# Patient Record
Sex: Female | Born: 1970 | Race: Black or African American | Hispanic: No | Smoking: Current some day smoker
Health system: Southern US, Community
[De-identification: ages and names within clinical notes are randomized; demographics above are authoritative.]

## PROBLEM LIST (undated history)

## (undated) DIAGNOSIS — R011 Cardiac murmur, unspecified: Secondary | ICD-10-CM

## (undated) DIAGNOSIS — I10 Essential (primary) hypertension: Secondary | ICD-10-CM

## (undated) DIAGNOSIS — Z789 Other specified health status: Secondary | ICD-10-CM

---

## 1998-12-27 ENCOUNTER — Inpatient Hospital Stay (HOSPITAL_COMMUNITY): Admission: AD | Admit: 1998-12-27 | Discharge: 1998-12-29 | Payer: Self-pay | Admitting: Obstetrics

## 1998-12-28 ENCOUNTER — Encounter: Payer: Self-pay | Admitting: Obstetrics & Gynecology

## 2004-05-03 ENCOUNTER — Emergency Department: Payer: Self-pay | Admitting: Emergency Medicine

## 2004-12-01 ENCOUNTER — Emergency Department: Payer: Self-pay | Admitting: Emergency Medicine

## 2005-05-24 ENCOUNTER — Other Ambulatory Visit: Payer: Self-pay

## 2005-05-24 ENCOUNTER — Emergency Department: Payer: Self-pay | Admitting: Emergency Medicine

## 2006-05-02 ENCOUNTER — Emergency Department: Payer: Self-pay | Admitting: Internal Medicine

## 2008-10-22 ENCOUNTER — Emergency Department: Payer: Self-pay | Admitting: Emergency Medicine

## 2009-01-15 ENCOUNTER — Emergency Department: Payer: Self-pay | Admitting: Emergency Medicine

## 2010-12-01 ENCOUNTER — Emergency Department: Payer: Self-pay | Admitting: Emergency Medicine

## 2011-06-23 ENCOUNTER — Emergency Department: Payer: Self-pay | Admitting: *Deleted

## 2012-05-24 ENCOUNTER — Emergency Department: Payer: Self-pay | Admitting: Emergency Medicine

## 2012-12-17 ENCOUNTER — Ambulatory Visit: Payer: Self-pay

## 2013-09-06 ENCOUNTER — Emergency Department: Payer: Self-pay | Admitting: Emergency Medicine

## 2015-02-04 ENCOUNTER — Encounter: Payer: Self-pay | Admitting: *Deleted

## 2015-02-04 ENCOUNTER — Emergency Department: Payer: Self-pay

## 2015-02-04 ENCOUNTER — Emergency Department
Admission: EM | Admit: 2015-02-04 | Discharge: 2015-02-04 | Disposition: A | Discharge status: Home / Self Care | Payer: Self-pay | Attending: Emergency Medicine | Admitting: Emergency Medicine

## 2015-02-04 DIAGNOSIS — M47816 Spondylosis without myelopathy or radiculopathy, lumbar region: Secondary | ICD-10-CM | POA: Insufficient documentation

## 2015-02-04 DIAGNOSIS — M47896 Other spondylosis, lumbar region: Secondary | ICD-10-CM

## 2015-02-04 DIAGNOSIS — R109 Unspecified abdominal pain: Secondary | ICD-10-CM | POA: Insufficient documentation

## 2015-02-04 LAB — URINALYSIS COMPLETE WITH MICROSCOPIC (ARMC ONLY)
Bilirubin Urine: NEGATIVE
Glucose, UA: NEGATIVE mg/dL
Hgb urine dipstick: NEGATIVE
Ketones, ur: NEGATIVE mg/dL
Leukocytes, UA: NEGATIVE
Nitrite: NEGATIVE
Protein, ur: NEGATIVE mg/dL
Specific Gravity, Urine: 1.026 (ref 1.005–1.030)
pH: 6 (ref 5.0–8.0)

## 2015-02-04 MED ORDER — MELOXICAM 15 MG PO TABS: 15.0000 mg | ORAL_TABLET | Freq: Every day | ORAL | Status: DC

## 2015-02-04 MED ORDER — IBUPROFEN 800 MG PO TABS
800.0000 mg | ORAL_TABLET | Freq: Once | ORAL | Status: AC
  Administered 2015-02-04: 800 mg via ORAL
  Filled 2015-02-04: qty 1

## 2015-02-04 MED ORDER — TRAMADOL HCL 50 MG PO TABS
50.0000 mg | ORAL_TABLET | Freq: Once | ORAL | Status: AC
  Administered 2015-02-04: 50 mg via ORAL
  Filled 2015-02-04: qty 1

## 2015-02-04 NOTE — ED Notes (Signed)
Pt states pain in her lower back for several months, pt states hx of bulging disc, pt in no distress, alert and oriented

## 2015-02-04 NOTE — ED Notes (Signed)
Pt reports lower back pain x several months. States gradually worsening, has a bulging disc. No new injury.

## 2015-02-04 NOTE — ED Provider Notes (Signed)
[facility] Emergency Department Provider Note  ____________________________________________  Time seen: Approximately 11:55 AM  I have reviewed the triage vital signs and the nursing notes.   HISTORY  Chief Complaint Back Pain    HPI Bonnie Mcdaniel is a 44 y.o. female patient complaining of low back pain and right flank pain. Patient said low back pain is gone on for several months. Patient states she believes is secondary to a history of bulging disc. Patient left flank pain has been going on for couple of days. Patient states she didn't have any dysuria was seen to have increased frequency and urgency. Patient denies any bowel dysfunction. He denies any radicular component to her pain.Patient rating the pain as a 10 over 10 describe the sharp. No palliative measures taken for this complaint.   History reviewed. No pertinent past medical history.  There are no active problems to display for this patient.   History reviewed. No pertinent past surgical history.  Current Outpatient Rx  Name  Route  Sig  Dispense  Refill  . meloxicam (MOBIC) 15 MG tablet   Oral   Take 1 tablet (15 mg total) by mouth daily.   30 tablet   2     Allergies Review of patient's allergies indicates no known allergies.  History reviewed. No pertinent family history.  Social History Social History  Substance Use Topics  . Smoking status: Never Smoker   . Smokeless tobacco: None  . Alcohol Use: No    Review of Systems Constitutional: No fever/chills Eyes: No visual changes. ENT: No sore throat. Cardiovascular: Denies chest pain. Respiratory: Denies shortness of breath. Gastrointestinal: No abdominal pain.  No nausea, no vomiting.  No diarrhea.  No constipation. Genitourinary: Negative for dysuria. Positive urinary frequency and urgency. Musculoskeletal: Positive for back pain Skin: Negative for rash. Neurological: Negative for headaches, focal weakness or  numbness. 10-point ROS otherwise negative.  ____________________________________________   PHYSICAL EXAM:  VITAL SIGNS: ED Triage Vitals  Enc Vitals Group     BP 02/04/15 1121 155/99 mmHg     Pulse Rate 02/04/15 1121 60     Resp 02/04/15 1121 16     Temp 02/04/15 1121 98 F (36.7 C)     Temp Source 02/04/15 1121 Oral     SpO2 02/04/15 1121 100 %     Weight 02/04/15 1121 205 lb (92.987 kg)     Height 02/04/15 1121 5\' 8"  (1.727 m)     Head Cir --      Peak Flow --      Pain Score 02/04/15 1120 10     Pain Loc --      Pain Edu? --      Excl. in GC? --     Constitutional: Alert and oriented. Well appearing and in no acute distress. Eyes: Conjunctivae are normal. PERRL. EOMI. Head: Atraumatic. Nose: No congestion/rhinnorhea. Mouth/Throat: Mucous membranes are moist.  Oropharynx non-erythematous. Neck: No stridor. No cervical spine tenderness to palpation. Hematological/Lymphatic/Immunilogical: No cervical lymphadenopathy. Cardiovascular: Normal rate, regular rhythm. Grossly normal heart sounds.  Good peripheral circulation. Respiratory: Normal respiratory effort.  No retractions. Lungs CTAB. Gastrointestinal: Soft and nontender. No distention. No abdominal bruits. No CVA tenderness. Genitourinary: Deferred Musculoskeletal: No spinal deformity patient's right CVA guarding. Patient has some moderate guarding palpation L3-S1. She has decreased range of motion flexion limited by complaining of pain. Lateral movements full and equal. She had negative straight leg test. . Neurologic:  Normal speech and language. No gross  focal neurologic deficits are appreciated. No gait instability. Skin:  Skin is warm, dry and intact. No rash noted. Psychiatric: Mood and affect are normal. Speech and behavior are normal.  ____________________________________________   LABS (all labs ordered are listed, but only abnormal results are displayed)  Labs Reviewed  URINALYSIS COMPLETEWITH  MICROSCOPIC ([facility] ONLY) - Abnormal; Notable for the following:    Color, Urine YELLOW (*)    APPearance HAZY (*)    Bacteria, UA RARE (*)    Squamous Epithelial / LPF 0-5 (*)    All other components within normal limits   ____________________________________________  EKG   ____________________________________________  RADIOLOGY  X-rays showed distended this mild in nature L5-S1. I, Joni Reining, personally viewed and evaluated these images as part of my medical decision making.   ____________________________________________   PROCEDURES  Procedure(s) performed: None  Critical Care performed: No  ____________________________________________   INITIAL IMPRESSION / ASSESSMENT AND PLAN / ED COURSE  Pertinent labs & imaging results that were available during my care of the patient were reviewed by me and considered in my medical decision making (see chart for details).  Degenerative disc disease of the lumbar spine. Discussed x-ray findings with patient. Advised to [facility]. Patient given prescription for meloxicam 15 mg a take 1 by mouth daily. ____________________________________________   FINAL CLINICAL IMPRESSION(S) / ED DIAGNOSES  Final diagnoses:  Other osteoarthritis of spine, lumbar region      Joni Reining, PA-C 02/04/15 1336  Governor Rooks, MD 02/04/15 [phone]

## 2015-04-15 ENCOUNTER — Ambulatory Visit: Payer: Self-pay | Admitting: Family Medicine

## 2015-06-14 ENCOUNTER — Emergency Department: Payer: 59

## 2015-06-14 ENCOUNTER — Encounter: Payer: Self-pay | Admitting: *Deleted

## 2015-06-14 ENCOUNTER — Emergency Department
Admission: EM | Admit: 2015-06-14 | Discharge: 2015-06-14 | Disposition: A | Discharge status: Home / Self Care | Payer: 59 | Attending: Student | Admitting: Student

## 2015-06-14 DIAGNOSIS — Z791 Long term (current) use of non-steroidal anti-inflammatories (NSAID): Secondary | ICD-10-CM | POA: Insufficient documentation

## 2015-06-14 DIAGNOSIS — M549 Dorsalgia, unspecified: Secondary | ICD-10-CM | POA: Diagnosis not present

## 2015-06-14 DIAGNOSIS — J209 Acute bronchitis, unspecified: Secondary | ICD-10-CM | POA: Diagnosis not present

## 2015-06-14 DIAGNOSIS — R05 Cough: Secondary | ICD-10-CM | POA: Diagnosis present

## 2015-06-14 HISTORY — DX: Cardiac murmur, unspecified: R01.1

## 2015-06-14 MED ORDER — GUAIFENESIN-CODEINE 100-10 MG/5ML PO SOLN: 10.0000 mL | Freq: Three times a day (TID) | ORAL | Status: DC | PRN

## 2015-06-14 MED ORDER — AZITHROMYCIN 250 MG PO TABS: ORAL_TABLET | ORAL | Status: DC

## 2015-06-14 MED ORDER — ALBUTEROL SULFATE HFA 108 (90 BASE) MCG/ACT IN AERS: 2.0000 | INHALATION_SPRAY | Freq: Four times a day (QID) | RESPIRATORY_TRACT | Status: DC | PRN

## 2015-06-14 NOTE — ED Notes (Signed)
Patient c/o productive cough with green mucous since last week. Patient c/o chest and back pain with cough.

## 2015-06-14 NOTE — Discharge Instructions (Signed)

## 2015-06-14 NOTE — ED Provider Notes (Signed)
[facility] Emergency Department Provider Note ____________________________________________  Time seen: Approximately 11:57 AM  I have reviewed the triage vital signs and the nursing notes.   HISTORY  Chief Complaint Cough   HPI Bonnie Mcdaniel is a 44 y.o. female who presents to the emergency department for evaluation of cough. Cough has been productive of green mucus. Symptoms have been present for the past 7-10 days. She states that she has pain in her back and chest when she coughs. She has been taking Mucinex and Vicks DayQuil without relief.   Past Medical History  Diagnosis Date  . Murmur     There are no active problems to display for this patient.   History reviewed. No pertinent past surgical history.  Current Outpatient Rx  Name  Route  Sig  Dispense  Refill  . albuterol (PROVENTIL HFA;VENTOLIN HFA) 108 (90 BASE) MCG/ACT inhaler   Inhalation   Inhale 2 puffs into the lungs every 6 (six) hours as needed for wheezing or shortness of breath (cough).   1 Inhaler   2   . azithromycin (ZITHROMAX) 250 MG tablet      2 tablets today, then 1 tablet for the next 4 days.   6 each   0   . guaiFENesin-codeine 100-10 MG/5ML syrup   Oral   Take 10 mLs by mouth 3 (three) times daily as needed.   120 mL   0   . meloxicam (MOBIC) 15 MG tablet   Oral   Take 1 tablet (15 mg total) by mouth daily.   30 tablet   2     Allergies Review of patient's allergies indicates no known allergies.  No family history on file.  Social History Social History  Substance Use Topics  . Smoking status: Never Smoker   . Smokeless tobacco: None  . Alcohol Use: No    Review of Systems Constitutional: No recent illness. Eyes: No visual changes. ENT: No sore throat. Cardiovascular: Denies chest pain or palpitations. Respiratory: Denies shortness of breath. Positive for cough Gastrointestinal: No abdominal pain.  Genitourinary: Negative for  dysuria. Musculoskeletal: Pain in chest and back with cough Skin: Negative for rash. Neurological: Negative for headaches, focal weakness or numbness. 10-point ROS otherwise negative.  ____________________________________________   PHYSICAL EXAM:  VITAL SIGNS: ED Triage Vitals  Enc Vitals Group     BP 06/14/15 1128 155/89 mmHg     Pulse Rate 06/14/15 1128 72     Resp 06/14/15 1128 18     Temp 06/14/15 1128 98.3 F (36.8 C)     Temp Source 06/14/15 1128 Oral     SpO2 06/14/15 1128 98 %     Weight 06/14/15 1128 180 lb (81.647 kg)     Height 06/14/15 1128  (1.727 m)     Head Cir --      Peak Flow --      Pain Score --      Pain Loc --      Pain Edu? --      Excl. in GC? --     Constitutional: Alert and oriented. Well appearing and in no acute distress. Eyes: Conjunctivae are normal. EOMI. Head: Atraumatic. Nose: No congestion/rhinnorhea. Neck: No stridor.  Respiratory: Normal respiratory effort. Lungs clear to auscultation throughout.   Musculoskeletal: Tenderness to chest wall with cough and palpation. Neurologic:  Normal speech and language. No gross focal neurologic deficits are appreciated. Speech is normal. No gait instability. Skin:  Skin is warm,  dry and intact. Atraumatic. Psychiatric: Mood and affect are normal. Speech and behavior are normal.  ____________________________________________   LABS (all labs ordered are listed, but only abnormal results are displayed)  Labs Reviewed - No data to display ____________________________________________  RADIOLOGY  Chest x-ray negative for acute cardiopulmonary abnormality. ____________________________________________   PROCEDURES  Procedure(s) performed: None   ____________________________________________   INITIAL IMPRESSION / ASSESSMENT AND PLAN / ED COURSE  Pertinent labs & imaging results that were available during my care of the patient were reviewed by me and considered in my medical decision  making (see chart for details).  Patient was prescribed azithromycin and Robitussin AC as well as Ventolin inhaler. She is to follow-up with the primary care provider for symptoms that are not improving over the next 2-3 days. She was advised to return to the emergency department for symptoms that change or worsen if she is unable schedule an appointment. ____________________________________________   FINAL CLINICAL IMPRESSION(S) / ED DIAGNOSES  Final diagnoses:  Acute bronchitis, unspecified organism       Chinita PesterCari B Lyrika Souders, FNP 06/14/15 1303  Gayla DossEryka A Gayle, MD 06/14/15 1620

## 2016-03-23 ENCOUNTER — Encounter: Payer: Self-pay | Admitting: Emergency Medicine

## 2016-03-23 ENCOUNTER — Emergency Department: Payer: Self-pay

## 2016-03-23 ENCOUNTER — Inpatient Hospital Stay
Admission: EM | Admit: 2016-03-23 | Discharge: 2016-03-25 | DRG: 871 | Disposition: A | Discharge status: Home / Self Care | Payer: Self-pay | Attending: Internal Medicine | Admitting: Internal Medicine

## 2016-03-23 DIAGNOSIS — A403 Sepsis due to Streptococcus pneumoniae: Principal | ICD-10-CM | POA: Diagnosis present

## 2016-03-23 DIAGNOSIS — J154 Pneumonia due to other streptococci: Secondary | ICD-10-CM | POA: Diagnosis present

## 2016-03-23 DIAGNOSIS — A419 Sepsis, unspecified organism: Secondary | ICD-10-CM

## 2016-03-23 DIAGNOSIS — Z79899 Other long term (current) drug therapy: Secondary | ICD-10-CM

## 2016-03-23 DIAGNOSIS — N390 Urinary tract infection, site not specified: Secondary | ICD-10-CM | POA: Diagnosis present

## 2016-03-23 DIAGNOSIS — Z8249 Family history of ischemic heart disease and other diseases of the circulatory system: Secondary | ICD-10-CM

## 2016-03-23 DIAGNOSIS — J189 Pneumonia, unspecified organism: Secondary | ICD-10-CM

## 2016-03-23 DIAGNOSIS — E876 Hypokalemia: Secondary | ICD-10-CM | POA: Diagnosis present

## 2016-03-23 DIAGNOSIS — J181 Lobar pneumonia, unspecified organism: Secondary | ICD-10-CM

## 2016-03-23 HISTORY — DX: Other specified health status: Z78.9

## 2016-03-23 LAB — URINALYSIS COMPLETE WITH MICROSCOPIC (ARMC ONLY)
BACTERIA UA: NONE SEEN
Bilirubin Urine: NEGATIVE
Glucose, UA: NEGATIVE mg/dL
Nitrite: NEGATIVE
PROTEIN: 30 mg/dL — AB
SPECIFIC GRAVITY, URINE: 1.021 (ref 1.005–1.030)
pH: 6 (ref 5.0–8.0)

## 2016-03-23 LAB — CSF CELL COUNT WITH DIFFERENTIAL
EOS CSF: 0 %
EOS CSF: 0 %
LYMPHS CSF: 65 %
Lymphs, CSF: 66 %
Monocyte-Macrophage-Spinal Fluid: 35 %
Monocyte-Macrophage-Spinal Fluid: 30 %
RBC Count, CSF: 2 /mm3 (ref 0–3)
RBC Count, CSF: 187 /mm3 — ABNORMAL HIGH (ref 0–3)
SEGMENTED NEUTROPHILS-CSF: 4 %
Segmented Neutrophils-CSF: 0 %
TUBE #: 4
TUBE #: 1
WBC, CSF: 4 /mm3 (ref 0–5)
WBC, CSF: 4 /mm3 (ref 0–5)

## 2016-03-23 LAB — CBC WITH DIFFERENTIAL/PLATELET
Basophils Absolute: 0.2 10*3/uL — ABNORMAL HIGH (ref 0–0.1)
Basophils Relative: 1 %
EOS ABS: 0 10*3/uL (ref 0–0.7)
EOS PCT: 0 %
HCT: 38.5 % (ref 35.0–47.0)
Hemoglobin: 13.2 g/dL (ref 12.0–16.0)
LYMPHS ABS: 0.4 10*3/uL — AB (ref 1.0–3.6)
Lymphocytes Relative: 1 %
MCH: 31.5 pg (ref 26.0–34.0)
MCHC: 34.4 g/dL (ref 32.0–36.0)
MCV: 91.7 fL (ref 80.0–100.0)
MONO ABS: 0.5 10*3/uL (ref 0.2–0.9)
Monocytes Relative: 2 %
Neutro Abs: 28.7 10*3/uL — ABNORMAL HIGH (ref 1.4–6.5)
Neutrophils Relative %: 96 %
PLATELETS: 257 10*3/uL (ref 150–440)
RBC: 4.2 MIL/uL (ref 3.80–5.20)
RDW: 14.9 % — AB (ref 11.5–14.5)
WBC: 29.7 10*3/uL — AB (ref 3.6–11.0)

## 2016-03-23 LAB — COMPREHENSIVE METABOLIC PANEL
ALT: 12 U/L — AB (ref 14–54)
ANION GAP: 6 (ref 5–15)
AST: 18 U/L (ref 15–41)
Albumin: 3.6 g/dL (ref 3.5–5.0)
Alkaline Phosphatase: 50 U/L (ref 38–126)
BUN: 11 mg/dL (ref 6–20)
CHLORIDE: 104 mmol/L (ref 101–111)
CO2: 24 mmol/L (ref 22–32)
CREATININE: 0.94 mg/dL (ref 0.44–1.00)
Calcium: 8.6 mg/dL — ABNORMAL LOW (ref 8.9–10.3)
Glucose, Bld: 106 mg/dL — ABNORMAL HIGH (ref 65–99)
POTASSIUM: 2.6 mmol/L — AB (ref 3.5–5.1)
SODIUM: 134 mmol/L — AB (ref 135–145)
Total Bilirubin: 1.2 mg/dL (ref 0.3–1.2)
Total Protein: 7.4 g/dL (ref 6.5–8.1)

## 2016-03-23 LAB — PROTEIN AND GLUCOSE, CSF
GLUCOSE CSF: 62 mg/dL (ref 40–70)
Total  Protein, CSF: 24 mg/dL (ref 15–45)

## 2016-03-23 LAB — PROTIME-INR
INR: 1.03
PROTHROMBIN TIME: 13.5 s (ref 11.4–15.2)

## 2016-03-23 LAB — LACTIC ACID, PLASMA: LACTIC ACID, VENOUS: 1.3 mmol/L (ref 0.5–1.9)

## 2016-03-23 LAB — INFLUENZA PANEL BY PCR (TYPE A & B)
H1N1FLUPCR: NOT DETECTED
Influenza A By PCR: NEGATIVE
Influenza B By PCR: NEGATIVE

## 2016-03-23 LAB — LIPASE, BLOOD: LIPASE: 20 U/L (ref 11–51)

## 2016-03-23 LAB — APTT: APTT: 33 s (ref 24–36)

## 2016-03-23 MED ORDER — DEXTROSE 5 % IV SOLN
500.0000 mg | INTRAVENOUS | Status: DC
  Administered 2016-03-24 (×2): 500 mg via INTRAVENOUS
  Filled 2016-03-23 (×3): qty 500

## 2016-03-23 MED ORDER — PIPERACILLIN-TAZOBACTAM 3.375 G IVPB: 3.3750 g | Freq: Three times a day (TID) | INTRAVENOUS | Status: DC

## 2016-03-23 MED ORDER — SODIUM CHLORIDE 0.9 % IV BOLUS (SEPSIS)
1000.0000 mL | Freq: Once | INTRAVENOUS | Status: AC
  Administered 2016-03-23: 1000 mL via INTRAVENOUS

## 2016-03-23 MED ORDER — LORAZEPAM 2 MG/ML IJ SOLN
1.0000 mg | Freq: Once | INTRAMUSCULAR | Status: AC
  Administered 2016-03-23: 1 mg via INTRAVENOUS
  Filled 2016-03-23: qty 1

## 2016-03-23 MED ORDER — VANCOMYCIN HCL IN DEXTROSE 750-5 MG/150ML-% IV SOLN: 750.0000 mg | Freq: Three times a day (TID) | INTRAVENOUS | Status: DC

## 2016-03-23 MED ORDER — SODIUM CHLORIDE 0.9 % IV BOLUS (SEPSIS)
500.0000 mL | Freq: Once | INTRAVENOUS | Status: AC
  Administered 2016-03-23: 500 mL via INTRAVENOUS

## 2016-03-23 MED ORDER — MORPHINE SULFATE (PF) 4 MG/ML IV SOLN
4.0000 mg | Freq: Once | INTRAVENOUS | Status: AC
  Administered 2016-03-23: 4 mg via INTRAVENOUS
  Filled 2016-03-23: qty 1

## 2016-03-23 MED ORDER — INFLUENZA VAC SPLIT QUAD 0.5 ML IM SUSY
0.5000 mL | PREFILLED_SYRINGE | INTRAMUSCULAR | Status: AC
  Administered 2016-03-24: 10:00:00 0.5 mL via INTRAMUSCULAR
  Filled 2016-03-23: qty 0.5

## 2016-03-23 MED ORDER — GUAIFENESIN-CODEINE 100-10 MG/5ML PO SOLN: 10.0000 mL | ORAL | Status: DC | PRN

## 2016-03-23 MED ORDER — LIDOCAINE HCL (PF) 1 % IJ SOLN
INTRAMUSCULAR | Status: DC
Start: 2016-03-23 — End: 2016-03-23
  Filled 2016-03-23: qty 5

## 2016-03-23 MED ORDER — DEXTROSE 5 % IV SOLN
INTRAVENOUS | Status: AC
  Filled 2016-03-23: qty 10

## 2016-03-23 MED ORDER — ALBUTEROL SULFATE (2.5 MG/3ML) 0.083% IN NEBU: 3.0000 mL | INHALATION_SOLUTION | Freq: Four times a day (QID) | RESPIRATORY_TRACT | Status: DC | PRN

## 2016-03-23 MED ORDER — VANCOMYCIN HCL IN DEXTROSE 750-5 MG/150ML-% IV SOLN
750.0000 mg | Freq: Three times a day (TID) | INTRAVENOUS | Status: DC
  Filled 2016-03-23 (×3): qty 150

## 2016-03-23 MED ORDER — MELOXICAM 7.5 MG PO TABS
15.0000 mg | ORAL_TABLET | Freq: Every day | ORAL | Status: DC
  Administered 2016-03-24 – 2016-03-25 (×2): 15 mg via ORAL
  Filled 2016-03-23 (×2): qty 2

## 2016-03-23 MED ORDER — HEPARIN SODIUM (PORCINE) 5000 UNIT/ML IJ SOLN
5000.0000 [IU] | Freq: Three times a day (TID) | INTRAMUSCULAR | Status: DC
  Administered 2016-03-24: 06:00:00 5000 [IU] via SUBCUTANEOUS
  Filled 2016-03-23: qty 1

## 2016-03-23 MED ORDER — POTASSIUM CHLORIDE CRYS ER 20 MEQ PO TBCR
40.0000 meq | EXTENDED_RELEASE_TABLET | Freq: Two times a day (BID) | ORAL | Status: AC
  Administered 2016-03-23 – 2016-03-24 (×3): 40 meq via ORAL
  Filled 2016-03-23 (×3): qty 2

## 2016-03-23 MED ORDER — PIPERACILLIN-TAZOBACTAM 3.375 G IVPB 30 MIN
3.3750 g | Freq: Once | INTRAVENOUS | Status: AC
  Administered 2016-03-23: 3.375 g via INTRAVENOUS
  Filled 2016-03-23: qty 50

## 2016-03-23 MED ORDER — ACETAMINOPHEN 500 MG PO TABS
1000.0000 mg | ORAL_TABLET | Freq: Once | ORAL | Status: AC
  Administered 2016-03-23: 1000 mg via ORAL
  Filled 2016-03-23: qty 2

## 2016-03-23 MED ORDER — DEXTROSE 5 % IV SOLN
1.0000 g | INTRAVENOUS | Status: DC
  Administered 2016-03-23: 1 g via INTRAVENOUS

## 2016-03-23 MED ORDER — VANCOMYCIN HCL IN DEXTROSE 1-5 GM/200ML-% IV SOLN
1000.0000 mg | Freq: Once | INTRAVENOUS | Status: AC
  Administered 2016-03-23: 1000 mg via INTRAVENOUS
  Filled 2016-03-23: qty 200

## 2016-03-23 MED ORDER — LIDOCAINE HCL (PF) 1 % IJ SOLN
INTRAMUSCULAR | Status: AC
  Filled 2016-03-23: qty 5

## 2016-03-23 MED ORDER — ONDANSETRON HCL 4 MG/2ML IJ SOLN
4.0000 mg | Freq: Once | INTRAMUSCULAR | Status: AC
  Administered 2016-03-23: 4 mg via INTRAVENOUS
  Filled 2016-03-23: qty 2

## 2016-03-23 NOTE — ED Notes (Signed)
Patient transported to CT 

## 2016-03-23 NOTE — ED Provider Notes (Signed)
[facility] Emergency Department Provider Note  Time seen: 4:16 PM  I have reviewed the triage vital signs and the nursing notes.   HISTORY  Chief Complaint Fever and Headache    HPI Bonnie Mcdaniel is a 45 y.o. female with no past medical historywho presents to the emergency department with headache and fever cough and congestion. According to the patient for the past one week she has been coughing. Over the past 24 hours she has developed a fever measured at 103 this morning, as well as a significant headache and is describing neck discomfort as well. States mild lower abdominal pain but denies any dysuria. States nausea, patient vomited in the emergency department. Patient states significant headache, worse with light or loud noises. Denies any significant migraine history in the past. Denies any known sick contacts. Describes the headache as moderate to severe. Neck pain is mild. Abdominal pain is mild. Cough is mild.  Past Medical History:  Diagnosis Date  . Murmur     There are no active problems to display for this patient.   History reviewed. No pertinent surgical history.  Prior to Admission medications   Medication Sig Start Date End Date Taking? Authorizing Provider  albuterol (PROVENTIL HFA;VENTOLIN HFA) 108 (90 BASE) MCG/ACT inhaler Inhale 2 puffs into the lungs every 6 (six) hours as needed for wheezing or shortness of breath (cough). 06/14/15   Chinita Pester, FNP  azithromycin (ZITHROMAX) 250 MG tablet 2 tablets today, then 1 tablet for the next 4 days. 06/14/15   Chinita Pester, FNP  guaiFENesin-codeine 100-10 MG/5ML syrup Take 10 mLs by mouth 3 (three) times daily as needed. 06/14/15   Chinita Pester, FNP  meloxicam (MOBIC) 15 MG tablet Take 1 tablet (15 mg total) by mouth daily. 02/04/15   Joni Reining, PA-C    No Known Allergies  History reviewed. No pertinent family history.  Social History Social History  Substance Use Topics   . Smoking status: Never Smoker  . Smokeless tobacco: Not on file  . Alcohol use No    Review of Systems Constitutional: Positive for fever Eyes: Positive for photophobia ENT: Mild congestion Cardiovascular: Negative for chest pain. Respiratory: Negative for shortness of breath. Positive for cough Gastrointestinal: Mild lower abdominal pain. Positive for nausea and vomiting. Negative for diarrhea. Genitourinary: Negative for dysuria. Musculoskeletal: Mild neck pain Skin: Negative for rash. Neurological: Significant headache. Denies focal weakness or numbness. 10-point ROS otherwise negative.  ____________________________________________   PHYSICAL EXAM:  VITAL SIGNS: ED Triage Vitals [03/23/16 1433]  Enc Vitals Group     BP (!) 142/91     Pulse Rate (!) 110     Resp 20     Temp (!) 103.1 F (39.5 C)     Temp Source Oral     SpO2 99 %     Weight 180 lb (81.6 kg)     Height 5\' 8"  (1.727 m)     Head Circumference      Peak Flow      Pain Score 10     Pain Loc      Pain Edu?      Excl. in GC?     Constitutional: Alert and oriented. Keeps eyes closed most of the exam Eyes: Normal exam, moderate photophobia. ENT   Head: Normocephalic and atraumatic. No nuchal rigidity.   Mouth/Throat: Mucous membranes are moist. Cardiovascular: Regular rhythm, rate around 100 bpm. No murmur. Respiratory: Normal respiratory effort without tachypnea nor  retractions. Breath sounds are clear and equal bilaterally. No wheezes/rales/rhonchi. Gastrointestinal: Soft, minimal suprapubic tenderness palpation. No rebound or guarding. Musculoskeletal: Nontender with normal range of motion in all extremities. No lower extremity tenderness or edema. Neurologic:  Normal speech and language. No gross focal neurologic deficits  Skin:  Skin is warm, dry and intact. No rash. Psychiatric: Mood and affect are normal. Speech and behavior are normal.   ____________________________________________     EKG  EKG reviewed and interpreted by social sinus tachycardia 104 bpm, narrow QRS, normal axis, normal intervals, nonspecific ST changes mild lateral depression.  ____________________________________________    RADIOLOGY  Chest x-ray shows right base opacity  ____________________________________________   INITIAL IMPRESSION / ASSESSMENT AND PLAN / ED COURSE  Pertinent labs & imaging results that were available during my care of the patient were reviewed by me and considered in my medical decision making (see chart for details).  Patient presents with cough, congestion, fever to 103 with significant headache and neck pain. Patient's labs are significant for leukocytosis of 29,000. Patient has no nuchal rigidity on exam. Negative Kernig and Brudzinski testing. Patient states cough, x-rays consistent with pneumonia. Given the patient's significant leukocytosis with fever headache and neck pain even though the chest x-ray shows a pneumonia we'll proceed with a lumbar puncture to rule out meningitis. I have also sent a flu swab.  LUMBAR PUNCTURE  Date/Time: 03/23/2016 at 5:34 PM Performed by: Minna AntisPADUCHOWSKI, Jaleia Hanke  Consent: Verbal consent obtained. Written consent obtained. Risks and benefits: risks, benefits and alternatives were discussed Consent given by: patient Patient understanding: patient states understanding of the procedure being performed  Patient consent: the patient's understanding of the procedure matches consent given  Procedure consent: procedure consent matches procedure scheduled  Relevant documents: relevant documents present and verified  Test results: test results available and properly labeled Site marked: the operative site was marked Imaging studies: imaging studies available  Required items: required blood products, implants, devices, and special equipment available  Patient identity confirmed: verbally with patient and arm band  Time out: Immediately prior  to procedure a "time out" was called to verify the correct patient, procedure, equipment, support staff and site/side marked as required.  Indications: rule out meningitis  Anesthesia: local infiltration Local anesthetic: lidocaine 1% without epinephrine Anesthetic total: 5 ml Patient sedated: morphine for analgesic Analgesia: morphine Preparation: Patient was prepped and draped in the usual sterile fashion. Lumbar space: L3-L4 interspace Patient's position: left lateral decubitus Needle gauge: 22 Needle length: 3.5 in Number of attempts: 2  Fluid appearance: clear Tubes of fluid: 4 Total volume: 8 ml Post-procedure: site cleaned and adhesive bandage applied Patient tolerance: Patient tolerated the procedure well with no immediate complications   ----------------------------------------- 7:24 PM on 03/23/2016 -----------------------------------------  CT head is negative. CSF appears negative. Influenza is negative. Urinalysis consistent with mild urinary tract infection. X-ray consistent with a right lobar pneumonia. Patient will be admitted to the hospital for continued treatment.   CRITICAL CARE Performed by: Minna AntisPADUCHOWSKI, Abrish Erny   Total critical care time: 60 minutes  Critical care time was exclusive of separately billable procedures and treating other patients.  Critical care was necessary to treat or prevent imminent or life-threatening deterioration.  Critical care was time spent personally by me on the following activities: development of treatment plan with patient and/or surrogate as well as nursing, discussions with consultants, evaluation of patient's response to treatment, examination of patient, obtaining history from patient or surrogate, ordering and performing treatments and interventions, ordering and  review of laboratory studies, ordering and review of radiographic studies, pulse oximetry and re-evaluation of patient's  condition.  ____________________________________________   FINAL CLINICAL IMPRESSION(S) / ED DIAGNOSES  Sepsis Urinary tract infection pneumonia    Minna Antis, MD 03/23/16 1925

## 2016-03-23 NOTE — ED Notes (Signed)
Pt c/o headache and fever since yesterday. Pt states she has stiff neck and has been vomiting. Pt A&O

## 2016-03-23 NOTE — ED Notes (Signed)
Meal tray given 

## 2016-03-23 NOTE — Progress Notes (Signed)
Pharmacy Antibiotic Note  Iris Remi HaggardL Beshears is a 45 y.o. female admitted on 03/23/2016 with sepsis.  Pharmacy has been consulted for vancomycin and piperacillin/tazobactam dosing.  Plan: Piperacillin/tazobactam 3.375 g IV q8h EI  Vancomycin 1000 mg dose given in ED. Will order vancomycin 750 mg IV q8h Goal vancomycin trough 15-20 mcg/mL Vancomycin trough ordered for 9/28 @ 2130 which is prior to 5th dose and should represent steady state.  Kinetics: Using adjusted body weight of 71 kg Ke: 0.076 Half-life: 9 hours Vd: 49 L  Cmin (estimated) ~16 mcg/mL  Height: 5\' 8"  (172.7 cm) Weight: 180 lb (81.6 kg) IBW/kg (Calculated) : 63.9  Temp (24hrs), Avg:100.9 F (38.3 C), Min:98.7 F (37.1 C), Max:103.1 F (39.5 C)   Recent Labs Lab 03/23/16 1445  WBC 29.7*  CREATININE 0.94  LATICACIDVEN 1.3    Estimated Creatinine Clearance: 85.6 mL/min (by C-G formula based on SCr of 0.94 mg/dL).    No Known Allergies  Antimicrobials this admission: vancomycin 9/27 >>  Piperacillin/tazobactam 9/27 >>   Dose adjustments this admission:  Microbiology results: 9/27 BCx: Sent 9/27 UCx: Sent  9/27 CSF: Sent  Thank you for allowing pharmacy to be a part of this patient's care.  Cindi CarbonMary M Morrisa Aldaba, PharmD, BCPS Clinical Pharmacist 03/23/2016 7:40 PM

## 2016-03-23 NOTE — ED Notes (Signed)
MD at bedside. 

## 2016-03-23 NOTE — H&P (Signed)
[facility] [place] [facility]   PATIENT NAME: Bonnie Mcdaniel    MR#:  [id number]  DATE OF BIRTH:  06-21-1971  DATE OF ADMISSION:  03/23/2016  PRIMARY CARE PHYSICIAN: Evelene Croon, MD   REQUESTING/REFERRING PHYSICIAN: [email or web address]  CHIEF COMPLAINT:   Chief Complaint  Patient presents with  . Fever  . Headache    HISTORY OF PRESENT ILLNESS: Bonnie Mcdaniel  is a 45 y.o. female with a known history of No known medical issues- feeling weakn and cough for 4-5 days- gradually getting weaker. Have brownish sputum production. Fever and chills. Also have headache. In ER noted to have pneumonia, mild positive UA. Lumber puncture is also done- negative.  PAST MEDICAL HISTORY:   Past Medical History:  Diagnosis Date  . Medical history non-contributory   . Murmur     PAST SURGICAL HISTORY: History reviewed. No pertinent surgical history.  SOCIAL HISTORY:  Social History  Substance Use Topics  . Smoking status: Never Smoker  . Smokeless tobacco: Not on file  . Alcohol use No    FAMILY HISTORY:  Family History  Problem Relation Age of Onset  . Hypertension Mother     DRUG ALLERGIES: No Known Allergies  REVIEW OF SYSTEMS:   CONSTITUTIONAL: positive for fever, fatigue or weakness.  EYES: No blurred or double vision.  EARS, NOSE, AND THROAT: No tinnitus or ear pain.  RESPIRATORY: positive for cough, shortness of breath,no wheezing or hemoptysis.  CARDIOVASCULAR: No chest pain, orthopnea, edema.  GASTROINTESTINAL: No nausea, vomiting, diarrhea or abdominal pain.  GENITOURINARY: No dysuria, hematuria.  ENDOCRINE: No polyuria, nocturia,  HEMATOLOGY: No anemia, easy bruising or bleeding SKIN: No rash or lesion. MUSCULOSKELETAL: No joint pain or arthritis.   NEUROLOGIC: No tingling, numbness, weakness.  PSYCHIATRY: No anxiety or depression.   MEDICATIONS AT HOME:  Prior to Admission medications   Medication Sig Start Date End Date Taking? Authorizing  Provider  albuterol (PROVENTIL HFA;VENTOLIN HFA) 108 (90 BASE) MCG/ACT inhaler Inhale 2 puffs into the lungs every 6 (six) hours as needed for wheezing or shortness of breath (cough). 06/14/15   Chinita Pester, FNP  azithromycin (ZITHROMAX) 250 MG tablet 2 tablets today, then 1 tablet for the next 4 days. 06/14/15   Chinita Pester, FNP  guaiFENesin-codeine 100-10 MG/5ML syrup Take 10 mLs by mouth 3 (three) times daily as needed. 06/14/15   Chinita Pester, FNP  meloxicam (MOBIC) 15 MG tablet Take 1 tablet (15 mg total) by mouth daily. 02/04/15   Joni Reining, PA-C      PHYSICAL EXAMINATION:   VITAL SIGNS: Blood pressure (!) 120/56, pulse 100, temperature 98.7 F (37.1 C), temperature source Oral, resp. rate 20, height 5\' 8"  (1.727 m), weight 81.6 kg (180 lb), last menstrual period 03/01/2016, SpO2 96 %.  GENERAL:  45 y.o.-year-old patient lying in the bed with no acute distress.  EYES: Pupils equal, round, reactive to light and accommodation. No scleral icterus. Extraocular muscles intact.  HEENT: Head atraumatic, normocephalic. Oropharynx and nasopharynx clear.  NECK:  Supple, no jugular venous distention. No thyroid enlargement, no tenderness.  LUNGS: Normal breath sounds bilaterally, no wheezing, bilateral crepitation. No use of accessory muscles of respiration.  CARDIOVASCULAR: S1, S2 normal. No murmurs, rubs, or gallops.  ABDOMEN: Soft, nontender, nondistended. Bowel sounds present. No organomegaly or mass.  EXTREMITIES: No pedal edema, cyanosis, or clubbing.  NEUROLOGIC: Cranial nerves II through XII are intact. Muscle strength 5/5 in all extremities. Sensation intact. Gait not checked.  PSYCHIATRIC: The patient is alert and oriented x 3.  SKIN: No obvious rash, lesion, or ulcer.   LABORATORY PANEL:   CBC  Recent Labs Lab 03/23/16 1445  WBC 29.7*  HGB 13.2  HCT 38.5  PLT 257  MCV 91.7  MCH 31.5  MCHC 34.4  RDW 14.9*  LYMPHSABS 0.4*  MONOABS 0.5  EOSABS 0.0   BASOSABS 0.2*   ------------------------------------------------------------------------------------------------------------------  Chemistries   Recent Labs Lab 03/23/16 1445  NA 134*  K 2.6*  CL 104  CO2 24  GLUCOSE 106*  BUN 11  CREATININE 0.94  CALCIUM 8.6*  AST 18  ALT 12*  ALKPHOS 50  BILITOT 1.2   ------------------------------------------------------------------------------------------------------------------ estimated creatinine clearance is 85.6 mL/min (by C-G formula based on SCr of 0.94 mg/dL). ------------------------------------------------------------------------------------------------------------------ No results for input(s): TSH, T4TOTAL, T3FREE, THYROIDAB in the last 72 hours.  Invalid input(s): FREET3   Coagulation profile  Recent Labs Lab 03/23/16 1445  INR 1.03   ------------------------------------------------------------------------------------------------------------------- No results for input(s): DDIMER in the last 72 hours. -------------------------------------------------------------------------------------------------------------------  Cardiac Enzymes No results for input(s): CKMB, TROPONINI, MYOGLOBIN in the last 168 hours.  Invalid input(s): CK ------------------------------------------------------------------------------------------------------------------ Invalid input(s): POCBNP  ---------------------------------------------------------------------------------------------------------------  Urinalysis    Component Value Date/Time   COLORURINE YELLOW (A) 03/23/2016 1700   APPEARANCEUR CLOUDY (A) 03/23/2016 1700   LABSPEC 1.021 03/23/2016 1700   PHURINE 6.0 03/23/2016 1700   GLUCOSEU NEGATIVE 03/23/2016 1700   HGBUR 1+ (A) 03/23/2016 1700   BILIRUBINUR NEGATIVE 03/23/2016 1700   KETONESUR TRACE (A) 03/23/2016 1700   PROTEINUR 30 (A) 03/23/2016 1700   NITRITE NEGATIVE 03/23/2016 1700   LEUKOCYTESUR 1+ (A) 03/23/2016  1700     RADIOLOGY: Ct Head Wo Contrast  Result Date: 03/23/2016 CLINICAL DATA:  Neck and fever since yesterday. Neck stiffness and vomiting. Initial encounter. EXAM: CT HEAD WITHOUT CONTRAST TECHNIQUE: Contiguous axial images were obtained from the base of the skull through the vertex without intravenous contrast. COMPARISON:  None. FINDINGS: Brain: Appears normal without hemorrhage, infarct, mass lesion, mass effect, midline shift or abnormal extra-axial fluid collection. No hydrocephalus or pneumocephalus. Vascular: Unremarkable. Skull: Appears normal. Sinuses/Orbits: Unremarkable. Other: None. IMPRESSION: Normal head CT. Electronically Signed   By: Drusilla Kannerhomas  Dalessio M.D.   On: 03/23/2016 18:28   Dg Chest Portable 1 View  Result Date: 03/23/2016 CLINICAL DATA:  Headache, vomiting EXAM: PORTABLE CHEST 1 VIEW COMPARISON:  06/14/2015 FINDINGS: There is hazy infiltrate/pneumonia right base medially. No pulmonary edema. Bony thorax is unremarkable. IMPRESSION: Hazy infiltrate/ pneumonia right base medially. Follow-up to resolution is recommended. Electronically Signed   By: Natasha MeadLiviu  Pop M.D.   On: 03/23/2016 15:46    EKG: Orders placed or performed during the hospital encounter of 03/23/16  . ED EKG 12-Lead  . ED EKG 12-Lead    IMPRESSION AND PLAN:  * Sepsis   elevated WBCs, tachycardia, tachypnea, Fever.   Pneumonia   IV rocephin+ Azithromycin.   Sputum cx.   Bl cx sent.  * Hypokalemia   Replace oral, recheck.  All the records are reviewed and case discussed with ED provider. Management plans discussed with the patient, family and they are in agreement.  CODE STATUS: Full code. Code Status History    This patient does not have a recorded code status. Please follow your organizational policy for patients in this situation.       TOTAL TIME TAKING CARE OF THIS PATIENT: 50 minutes.    Altamese DillingVACHHANI, Jissel Slavens M.D on 03/23/2016   Between 7am to 6pm -  Pager - [phone]  After  6pm go to [email or web address] - password [email or web address]  [facility] [place] [facility]  Office  [phone]  CC: Primary care physician; Evelene Croon, MD   Note: This dictation was prepared with Dragon dictation along with smaller phrase technology. Any transcriptional errors that result from this process are unintentional.

## 2016-03-23 NOTE — ED Triage Notes (Signed)
Pt c/o headache, neck pain, and fever for 2 days. Has also been vomiting. Pt reports this is worst headache she has had. Hurts to move neck.

## 2016-03-24 LAB — BLOOD CULTURE ID PANEL (REFLEXED)
Acinetobacter baumannii: NOT DETECTED
CANDIDA ALBICANS: NOT DETECTED
CANDIDA GLABRATA: NOT DETECTED
CANDIDA KRUSEI: NOT DETECTED
CANDIDA PARAPSILOSIS: NOT DETECTED
CANDIDA TROPICALIS: NOT DETECTED
ENTEROBACTERIACEAE SPECIES: NOT DETECTED
ESCHERICHIA COLI: NOT DETECTED
Enterobacter cloacae complex: NOT DETECTED
Enterococcus species: NOT DETECTED
HAEMOPHILUS INFLUENZAE: NOT DETECTED
KLEBSIELLA OXYTOCA: NOT DETECTED
KLEBSIELLA PNEUMONIAE: NOT DETECTED
LISTERIA MONOCYTOGENES: NOT DETECTED
Neisseria meningitidis: NOT DETECTED
Proteus species: NOT DETECTED
Pseudomonas aeruginosa: NOT DETECTED
STREPTOCOCCUS AGALACTIAE: NOT DETECTED
STREPTOCOCCUS PNEUMONIAE: DETECTED — AB
Serratia marcescens: NOT DETECTED
Staphylococcus aureus (BCID): NOT DETECTED
Staphylococcus species: NOT DETECTED
Streptococcus pyogenes: NOT DETECTED
Streptococcus species: DETECTED — AB

## 2016-03-24 LAB — URINE CULTURE

## 2016-03-24 LAB — BASIC METABOLIC PANEL
ANION GAP: 4 — AB (ref 5–15)
BUN: 15 mg/dL (ref 6–20)
CO2: 25 mmol/L (ref 22–32)
Calcium: 7.9 mg/dL — ABNORMAL LOW (ref 8.9–10.3)
Chloride: 109 mmol/L (ref 101–111)
Creatinine, Ser: 1.03 mg/dL — ABNORMAL HIGH (ref 0.44–1.00)
GLUCOSE: 94 mg/dL (ref 65–99)
POTASSIUM: 2.8 mmol/L — AB (ref 3.5–5.1)
Sodium: 138 mmol/L (ref 135–145)

## 2016-03-24 LAB — CBC
HEMATOCRIT: 33.9 % — AB (ref 35.0–47.0)
HEMOGLOBIN: 11.5 g/dL — AB (ref 12.0–16.0)
MCH: 31.4 pg (ref 26.0–34.0)
MCHC: 33.9 g/dL (ref 32.0–36.0)
MCV: 92.6 fL (ref 80.0–100.0)
Platelets: 210 10*3/uL (ref 150–440)
RBC: 3.66 MIL/uL — AB (ref 3.80–5.20)
RDW: 15.1 % — ABNORMAL HIGH (ref 11.5–14.5)
WBC: 27.6 10*3/uL — ABNORMAL HIGH (ref 3.6–11.0)

## 2016-03-24 LAB — MAGNESIUM: Magnesium: 1.6 mg/dL — ABNORMAL LOW (ref 1.7–2.4)

## 2016-03-24 MED ORDER — ENOXAPARIN SODIUM 40 MG/0.4ML ~~LOC~~ SOLN: 40.0000 mg | SUBCUTANEOUS | Status: DC

## 2016-03-24 MED ORDER — DEXTROSE 5 % IV SOLN
2.0000 g | INTRAVENOUS | Status: DC
  Administered 2016-03-24 – 2016-03-25 (×2): 2 g via INTRAVENOUS
  Filled 2016-03-24 (×3): qty 2

## 2016-03-24 NOTE — Progress Notes (Signed)
BCID report called from lab 03/24/16 at 1023.  Information called to Clovia CuffLisa Kluttz, Clinical Pharmacist 03/24/16 at 1026  1 set of Blood cultures: both bottles + for GPC= + Streptococcus Pneumoniae.   Bari MantisKristin Liyla Radliff PharmD Clinical Pharmacist 03/24/2016

## 2016-03-24 NOTE — Progress Notes (Signed)
[facility] - [place] [facility]   PATIENT NAME: Bonnie Mcdaniel    MR#:  [id number]  DATE OF BIRTH:  03-09-1971  SUBJECTIVE:  CHIEF COMPLAINT:   Chief Complaint  Patient presents with  . Fever  . Headache     Came with severe weakness and cough fro 4-5 days. Found to have sepsis and pneumonia.   Feels better today.  REVIEW OF SYSTEMS:  CONSTITUTIONAL: No fever, fatigue or weakness.  EYES: No blurred or double vision.  EARS, NOSE, AND THROAT: No tinnitus or ear pain.  RESPIRATORY: positive for cough, shortness of breath, no wheezing or hemoptysis.  CARDIOVASCULAR: No chest pain, orthopnea, edema.  GASTROINTESTINAL: No nausea, vomiting, diarrhea or abdominal pain.  GENITOURINARY: No dysuria, hematuria.  ENDOCRINE: No polyuria, nocturia,  HEMATOLOGY: No anemia, easy bruising or bleeding SKIN: No rash or lesion. MUSCULOSKELETAL: No joint pain or arthritis.   NEUROLOGIC: No tingling, numbness, weakness.  PSYCHIATRY: No anxiety or depression.   ROS  DRUG ALLERGIES:  No Known Allergies  VITALS:  Blood pressure 129/84, pulse 88, temperature 98.5 F (36.9 C), temperature source Oral, resp. rate 18, height 5\' 8"  (1.727 m), weight 81.6 kg (180 lb), last menstrual period 03/01/2016, SpO2 100 %.  PHYSICAL EXAMINATION:  GENERAL:  45 y.o.-year-old patient lying in the bed with no acute distress.  EYES: Pupils equal, round, reactive to light and accommodation. No scleral icterus. Extraocular muscles intact.  HEENT: Head atraumatic, normocephalic. Oropharynx and nasopharynx clear.  NECK:  Supple, no jugular venous distention. No thyroid enlargement, no tenderness.  LUNGS: Normal breath sounds bilaterally, no wheezing, some crepitation. No use of accessory muscles of respiration.  CARDIOVASCULAR: S1, S2 normal. No murmurs, rubs, or gallops.  ABDOMEN: Soft, nontender, nondistended. Bowel sounds present. No organomegaly or mass.  EXTREMITIES: No pedal edema, cyanosis, or  clubbing.  NEUROLOGIC: Cranial nerves II through XII are intact. Muscle strength 5/5 in all extremities. Sensation intact. Gait not checked.  PSYCHIATRIC: The patient is alert and oriented x 3.  SKIN: No obvious rash, lesion, or ulcer.   Physical Exam LABORATORY PANEL:   CBC  Recent Labs Lab 03/24/16 0530  WBC 27.6*  HGB 11.5*  HCT 33.9*  PLT 210   ------------------------------------------------------------------------------------------------------------------  Chemistries   Recent Labs Lab 03/23/16 1445 03/24/16 0530  NA 134* 138  K 2.6* 2.8*  CL 104 109  CO2 24 25  GLUCOSE 106* 94  BUN 11 15  CREATININE 0.94 1.03*  CALCIUM 8.6* 7.9*  AST 18  --   ALT 12*  --   ALKPHOS 50  --   BILITOT 1.2  --    ------------------------------------------------------------------------------------------------------------------  Cardiac Enzymes No results for input(s): TROPONINI in the last 168 hours. ------------------------------------------------------------------------------------------------------------------  RADIOLOGY:  Ct Head Wo Contrast  Result Date: 03/23/2016 CLINICAL DATA:  Neck and fever since yesterday. Neck stiffness and vomiting. Initial encounter. EXAM: CT HEAD WITHOUT CONTRAST TECHNIQUE: Contiguous axial images were obtained from the base of the skull through the vertex without intravenous contrast. COMPARISON:  None. FINDINGS: Brain: Appears normal without hemorrhage, infarct, mass lesion, mass effect, midline shift or abnormal extra-axial fluid collection. No hydrocephalus or pneumocephalus. Vascular: Unremarkable. Skull: Appears normal. Sinuses/Orbits: Unremarkable. Other: None. IMPRESSION: Normal head CT. Electronically Signed   By: Drusilla Kannerhomas  Dalessio M.D.   On: 03/23/2016 18:28   Dg Chest Portable 1 View  Result Date: 03/23/2016 CLINICAL DATA:  Headache, vomiting EXAM: PORTABLE CHEST 1 VIEW COMPARISON:  06/14/2015 FINDINGS: There is hazy infiltrate/pneumonia  right base  medially. No pulmonary edema. Bony thorax is unremarkable. IMPRESSION: Hazy infiltrate/ pneumonia right base medially. Follow-up to resolution is recommended. Electronically Signed   By: Natasha Mead M.D.   On: 03/23/2016 15:46    ASSESSMENT AND PLAN:   Principal Problem:   Sepsis (HCC) Active Problems:   Pneumonia  * Sepsis with strep pneumoia bacteremia   elevated WBCs, tachycardia, tachypnea, Fever.   Pneumonia   IV rocephin+ Azithromycin.   Sputum cx.   Bl cx - strep pneumoia.   Repeat bl cx, ID consult.  * Hypokalemia   Replace oral, recheck.   All the records are reviewed and case discussed with Care Management/Social Workerr. Management plans discussed with the patient, family and they are in agreement.  CODE STATUS: full.  TOTAL TIME TAKING CARE OF THIS PATIENT: 35 minutes.     POSSIBLE D/C IN 1-2 DAYS, DEPENDING ON CLINICAL CONDITION.   Altamese Dilling M.D on 03/24/2016   Between 7am to 6pm - Pager - [phone]  After 6pm go to [email or web address] - password [email or web address]  [facility] [place] [facility]  Office  [phone]  CC: Primary care physician; Evelene Croon, MD  Note: This dictation was prepared with Dragon dictation along with smaller phrase technology. Any transcriptional errors that result from this process are unintentional.

## 2016-03-24 NOTE — Progress Notes (Signed)
PHARMACY - PHYSICIAN COMMUNICATION CRITICAL VALUE ALERT - BLOOD CULTURE IDENTIFICATION (BCID)  Results for orders placed or performed during the hospital encounter of 03/23/16  Blood Culture ID Panel (Reflexed) (Collected: 03/23/2016  3:22 PM)  Result Value Ref Range   Enterococcus species NOT DETECTED NOT DETECTED   Listeria monocytogenes NOT DETECTED NOT DETECTED   Staphylococcus species NOT DETECTED NOT DETECTED   Staphylococcus aureus NOT DETECTED NOT DETECTED   Streptococcus species DETECTED (A) NOT DETECTED   Streptococcus agalactiae NOT DETECTED NOT DETECTED   Streptococcus pneumoniae DETECTED (A) NOT DETECTED   Streptococcus pyogenes NOT DETECTED NOT DETECTED   Acinetobacter baumannii NOT DETECTED NOT DETECTED   Enterobacteriaceae species NOT DETECTED NOT DETECTED   Enterobacter cloacae complex NOT DETECTED NOT DETECTED   Escherichia coli NOT DETECTED NOT DETECTED   Klebsiella oxytoca NOT DETECTED NOT DETECTED   Klebsiella pneumoniae NOT DETECTED NOT DETECTED   Proteus species NOT DETECTED NOT DETECTED   Serratia marcescens NOT DETECTED NOT DETECTED   Haemophilus influenzae NOT DETECTED NOT DETECTED   Neisseria meningitidis NOT DETECTED NOT DETECTED   Pseudomonas aeruginosa NOT DETECTED NOT DETECTED   Candida albicans NOT DETECTED NOT DETECTED   Candida glabrata NOT DETECTED NOT DETECTED   Candida krusei NOT DETECTED NOT DETECTED   Candida parapsilosis NOT DETECTED NOT DETECTED   Candida tropicalis NOT DETECTED NOT DETECTED    Name of physician (or Provider) Contacted: Vachhani  Changes to prescribed antibiotics required: Increased Ceftriaxone dose to 2g. Ordered repeat blood cultures  Clovia CuffLisa Svara Twyman, PharmD, BCPS 03/24/2016 11:23 AM

## 2016-03-24 NOTE — Plan of Care (Signed)
Problem: Respiratory: Goal: Respiratory status will improve Outcome: Progressing Pt verbalized feeling much better, ready to go home; IV antibiotic dose changed per MD order

## 2016-03-25 ENCOUNTER — Other Ambulatory Visit: Payer: Self-pay | Admitting: Infectious Diseases

## 2016-03-25 LAB — CBC
HEMATOCRIT: 33.9 % — AB (ref 35.0–47.0)
Hemoglobin: 11.3 g/dL — ABNORMAL LOW (ref 12.0–16.0)
MCH: 31.2 pg (ref 26.0–34.0)
MCHC: 33.3 g/dL (ref 32.0–36.0)
MCV: 93.4 fL (ref 80.0–100.0)
PLATELETS: 190 10*3/uL (ref 150–440)
RBC: 3.63 MIL/uL — ABNORMAL LOW (ref 3.80–5.20)
RDW: 15.4 % — AB (ref 11.5–14.5)
WBC: 14.3 10*3/uL — AB (ref 3.6–11.0)

## 2016-03-25 LAB — BASIC METABOLIC PANEL
ANION GAP: 3 — AB (ref 5–15)
BUN: 13 mg/dL (ref 6–20)
CALCIUM: 8.3 mg/dL — AB (ref 8.9–10.3)
CO2: 24 mmol/L (ref 22–32)
CREATININE: 0.89 mg/dL (ref 0.44–1.00)
Chloride: 112 mmol/L — ABNORMAL HIGH (ref 101–111)
Glucose, Bld: 80 mg/dL (ref 65–99)
Potassium: 3.5 mmol/L (ref 3.5–5.1)
SODIUM: 139 mmol/L (ref 135–145)

## 2016-03-25 MED ORDER — ALBUTEROL SULFATE HFA 108 (90 BASE) MCG/ACT IN AERS: 2.0000 | INHALATION_SPRAY | Freq: Four times a day (QID) | RESPIRATORY_TRACT | 2 refills | Status: DC | PRN

## 2016-03-25 MED ORDER — GUAIFENESIN-CODEINE 100-10 MG/5ML PO SOLN: 10.0000 mL | Freq: Three times a day (TID) | ORAL | 0 refills | Status: DC | PRN

## 2016-03-25 MED ORDER — AZITHROMYCIN 250 MG PO TABS: 250.0000 mg | ORAL_TABLET | Freq: Every day | ORAL | 0 refills | Status: AC

## 2016-03-25 MED ORDER — DIPHENHYDRAMINE HCL 25 MG PO CAPS: 25.0000 mg | ORAL_CAPSULE | Freq: Every evening | ORAL | Status: DC | PRN

## 2016-03-25 MED ORDER — DIPHENHYDRAMINE HCL 25 MG PO CAPS
25.0000 mg | ORAL_CAPSULE | Freq: Once | ORAL | Status: AC
Start: 2016-03-25 — End: 2016-03-25
  Administered 2016-03-25: 25 mg via ORAL
  Filled 2016-03-25: qty 1

## 2016-03-25 MED ORDER — CEFUROXIME AXETIL 250 MG PO TABS: 250.0000 mg | ORAL_TABLET | Freq: Two times a day (BID) | ORAL | 0 refills | Status: DC

## 2016-03-25 NOTE — Progress Notes (Signed)
[facility]         [place]   03/25/2016  Patient: Bonnie Mcdaniel   Date of Birth:  Jun 30, 1970  Date of admission:  03/23/2016  Date of Discharge  03/25/2016    To Whom it May Concern:   [name]  may return to work on 03/28/16.  PHYSICAL ACTIVITY:  Full, no restrictions.  If you have any questions or concerns, please don't hesitate to call.  Sincerely,   Altamese DillingVACHHANI, Jamarcus Laduke M.D Pager [phone]- [phone] Office : [phone]   .

## 2016-03-25 NOTE — Discharge Summary (Signed)
[facility] - [place] at [facility]   PATIENT NAME: Bonnie Mcdaniel    MR#:  [id number]  DATE OF BIRTH:  1971-06-11  DATE OF ADMISSION:  03/23/2016 ADMITTING PHYSICIAN: Altamese Dilling, MD  DATE OF DISCHARGE: 03/25/2016  2:46 PM  PRIMARY CARE PHYSICIAN: Evelene Croon, MD    ADMISSION DIAGNOSIS:  UTI (lower urinary tract infection) [N39.0] Right lower lobe pneumonia [J18.9] Sepsis, due to unspecified organism (HCC) [A41.9]  DISCHARGE DIAGNOSIS:  Principal Problem:   Sepsis (HCC) Active Problems:   Pneumonia    Bacteremia- strep pneumonia  SECONDARY DIAGNOSIS:   Past Medical History:  Diagnosis Date  . Medical history non-contributory   . Murmur     HOSPITAL COURSE:   * Sepsis with strep pneumoia bacteremia elevated WBCs, tachycardia, tachypnea, Fever. Pneumonia IV rocephin+ Azithromycin. Sputum cx. Bl cx - strep pneumoia. Sensitivity pending, but pt feeling very much at baseline and eager to go home, so advised to follow with PMD and let him confirm sensitivity in 3-4 days.   If she start having fever- advised to come back.   Repeat bl cx, are negative.  * Hypokalemia Replace oral, recheck.  DISCHARGE CONDITIONS:   Stable.  CONSULTS OBTAINED:  Treatment Team:  Mick Sell, MD  DRUG ALLERGIES:  No Known Allergies  DISCHARGE MEDICATIONS:   Discharge Medication List as of 03/25/2016  2:21 PM    START taking these medications   Details  azithromycin (ZITHROMAX) 250 MG tablet Take 1 tablet (250 mg total) by mouth daily., Starting Fri 03/25/2016, Until Mon 03/28/2016, Print    cefUROXime (CEFTIN) 250 MG tablet Take 1 tablet (250 mg total) by mouth 2 (two) times daily with a meal., Starting Fri 03/25/2016, Print      CONTINUE these medications which have CHANGED   Details  albuterol (PROVENTIL HFA;VENTOLIN HFA) 108 (90 Base) MCG/ACT inhaler Inhale 2 puffs into the lungs every 6 (six) hours as needed for  wheezing or shortness of breath (cough)., Starting Fri 03/25/2016, Print    guaiFENesin-codeine 100-10 MG/5ML syrup Take 10 mLs by mouth 3 (three) times daily as needed., Starting Fri 03/25/2016, Print      STOP taking these medications     meloxicam (MOBIC) 15 MG tablet          DISCHARGE INSTRUCTIONS:    Follow with PMD in 3-4 days to follow sensitivity on blood cx.  If you experience worsening of your admission symptoms, develop shortness of breath, life threatening emergency, suicidal or homicidal thoughts you must seek medical attention immediately by calling 911 or calling your MD immediately  if symptoms less severe.  You Must read complete instructions/literature along with all the possible adverse reactions/side effects for all the Medicines you take and that have been prescribed to you. Take any new Medicines after you have completely understood and accept all the possible adverse reactions/side effects.   Please note  You were cared for by a hospitalist during your hospital stay. If you have any questions about your discharge medications or the care you received while you were in the hospital after you are discharged, you can call the unit and asked to speak with the hospitalist on call if the hospitalist that took care of you is not available. Once you are discharged, your primary care physician will handle any further medical issues. Please note that NO REFILLS for any discharge medications will be authorized once you are discharged, as it is imperative that you return to your primary care  physician (or establish a relationship with a primary care physician if you do not have one) for your aftercare needs so that they can reassess your need for medications and monitor your lab values.    Today   CHIEF COMPLAINT:   Chief Complaint  Patient presents with  . Fever  . Headache    HISTORY OF PRESENT ILLNESS:  Bonnie Mcdaniel  is a 45 y.o. female with a known history of No  known medical issues- feeling weakn and cough for 4-5 days- gradually getting weaker. Have brownish sputum production. Fever and chills. Also have headache. In ER noted to have pneumonia, mild positive UA. Lumber puncture is also done- negative.   VITAL SIGNS:  Blood pressure (!) 146/96, pulse 74, temperature 98.5 F (36.9 C), temperature source Oral, resp. rate 19, height 5\' 8"  (1.727 m), weight 81.6 kg (180 lb), last menstrual period 03/01/2016, SpO2 100 %.  I/O:   Intake/Output Summary (Last 24 hours) at 03/25/16 1446 Last data filed at 03/25/16 1300  Gross per 24 hour  Intake             1002 ml  Output                0 ml  Net             1002 ml    PHYSICAL EXAMINATION:   GENERAL:  45 y.o.-year-old patient lying in the bed with no acute distress.  EYES: Pupils equal, round, reactive to light and accommodation. No scleral icterus. Extraocular muscles intact.  HEENT: Head atraumatic, normocephalic. Oropharynx and nasopharynx clear.  NECK:  Supple, no jugular venous distention. No thyroid enlargement, no tenderness.  LUNGS: Normal breath sounds bilaterally, no wheezing, some crepitation. No use of accessory muscles of respiration.  CARDIOVASCULAR: S1, S2 normal. No murmurs, rubs, or gallops.  ABDOMEN: Soft, nontender, nondistended. Bowel sounds present. No organomegaly or mass.  EXTREMITIES: No pedal edema, cyanosis, or clubbing.  NEUROLOGIC: Cranial nerves II through XII are intact. Muscle strength 5/5 in all extremities. Sensation intact. Gait not checked.  PSYCHIATRIC: The patient is alert and oriented x 3.  SKIN: No obvious rash, lesion, or ulcer.   DATA REVIEW:   CBC  Recent Labs Lab 03/25/16 0332  WBC 14.3*  HGB 11.3*  HCT 33.9*  PLT 190    Chemistries   Recent Labs Lab 03/23/16 1445 03/24/16 0530 03/25/16 0332  NA 134* 138 139  K 2.6* 2.8* 3.5  CL 104 109 112*  CO2 24 25 24   GLUCOSE 106* 94 80  BUN 11 15 13   CREATININE 0.94 1.03* 0.89  CALCIUM 8.6*  7.9* 8.3*  MG  --  1.6*  --   AST 18  --   --   ALT 12*  --   --   ALKPHOS 50  --   --   BILITOT 1.2  --   --     Cardiac Enzymes No results for input(s): TROPONINI in the last 168 hours.  Microbiology Results  Results for orders placed or performed during the hospital encounter of 03/23/16  Blood culture (single)     Status: Abnormal (Preliminary result)   Collection Time: 03/23/16  3:22 PM  Result Value Ref Range Status   Specimen Description BLOOD RIGHT ASSIST CONTROL  Final   Special Requests BOTTLES DRAWN AEROBIC AND ANAEROBIC 4CC  Final   Culture  Setup Time   Final    Organism ID to follow GRAM POSITIVE COCCI IN BOTH AEROBIC  AND ANAEROBIC BOTTLES CRITICAL RESULT CALLED TO, READ BACK BY AND VERIFIED WITH: KRISTIN MERRILL 03/24/16 1015 SGD    Culture (A)  Final    STREPTOCOCCUS PNEUMONIAE SUSCEPTIBILITIES TO FOLLOW [facility]formed [facility]    Report Status PENDING  Incomplete  Blood Culture ID Panel (Reflexed)     Status: Abnormal   Collection Time: 03/23/16  3:22 PM  Result Value Ref Range Status   Enterococcus species NOT DETECTED NOT DETECTED Final   Listeria monocytogenes NOT DETECTED NOT DETECTED Final   Staphylococcus species NOT DETECTED NOT DETECTED Final   Staphylococcus aureus NOT DETECTED NOT DETECTED Final   Streptococcus species DETECTED (A) NOT DETECTED Final    Comment: CRITICAL RESULT CALLED TO, READ BACK BY AND VERIFIED WITH: KRISTIN MERRILL 03/24/16 1015 SGD    Streptococcus agalactiae NOT DETECTED NOT DETECTED Final   Streptococcus pneumoniae DETECTED (A) NOT DETECTED Final    Comment: CRITICAL RESULT CALLED TO, READ BACK BY AND VERIFIED WITH: KRISTIN MERRILL 03/24/16 1015 SGD    Streptococcus pyogenes NOT DETECTED NOT DETECTED Final   Acinetobacter baumannii NOT DETECTED NOT DETECTED Final   Enterobacteriaceae species NOT DETECTED NOT DETECTED Final   Enterobacter cloacae complex NOT DETECTED NOT DETECTED Final   Escherichia coli NOT  DETECTED NOT DETECTED Final   Klebsiella oxytoca NOT DETECTED NOT DETECTED Final   Klebsiella pneumoniae NOT DETECTED NOT DETECTED Final   Proteus species NOT DETECTED NOT DETECTED Final   Serratia marcescens NOT DETECTED NOT DETECTED Final   Haemophilus influenzae NOT DETECTED NOT DETECTED Final   Neisseria meningitidis NOT DETECTED NOT DETECTED Final   Pseudomonas aeruginosa NOT DETECTED NOT DETECTED Final   Candida albicans NOT DETECTED NOT DETECTED Final   Candida glabrata NOT DETECTED NOT DETECTED Final   Candida krusei NOT DETECTED NOT DETECTED Final   Candida parapsilosis NOT DETECTED NOT DETECTED Final   Candida tropicalis NOT DETECTED NOT DETECTED Final  CSF culture     Status: None (Preliminary result)   Collection Time: 03/23/16  4:15 PM  Result Value Ref Range Status   Specimen Description CSF  Final   Special Requests NONE  Final   Gram Stain CYTO SPIN NO ORGANISMS SEEN RARE WBC RARE RBC   Final   Culture   Final    NO GROWTH 2 DAYS Performed at [facility]    Report Status PENDING  Incomplete  Urine culture     Status: Abnormal   Collection Time: 03/23/16  5:00 PM  Result Value Ref Range Status   Specimen Description URINE, RANDOM  Final   Special Requests NONE  Final   Culture MULTIPLE SPECIES PRESENT, SUGGEST RECOLLECTION (A)  Final   Report Status 03/24/2016 FINAL  Final  CULTURE, BLOOD (ROUTINE X 2) w Reflex to ID Panel     Status: None (Preliminary result)   Collection Time: 03/24/16 11:52 AM  Result Value Ref Range Status   Specimen Description BLOOD LEFT ASSIST CONTROL  Final   Special Requests BOTTLES DRAWN AEROBIC AND ANAEROBIC 4CC  Final   Culture NO GROWTH < 24 HOURS  Final   Report Status PENDING  Incomplete  CULTURE, BLOOD (ROUTINE X 2) w Reflex to ID Panel     Status: None (Preliminary result)   Collection Time: 03/24/16 11:58 AM  Result Value Ref Range Status   Specimen Description BLOOD LEFT HAND  Final   Special Requests   Final     BOTTLES DRAWN AEROBIC AND ANAEROBIC  ANAEROBIC 4CC, AEROBIC  4.5CC   Culture NO GROWTH < 24 HOURS  Final   Report Status PENDING  Incomplete    RADIOLOGY:  Ct Head Wo Contrast  Result Date: 03/23/2016 CLINICAL DATA:  Neck and fever since yesterday. Neck stiffness and vomiting. Initial encounter. EXAM: CT HEAD WITHOUT CONTRAST TECHNIQUE: Contiguous axial images were obtained from the base of the skull through the vertex without intravenous contrast. COMPARISON:  None. FINDINGS: Brain: Appears normal without hemorrhage, infarct, mass lesion, mass effect, midline shift or abnormal extra-axial fluid collection. No hydrocephalus or pneumocephalus. Vascular: Unremarkable. Skull: Appears normal. Sinuses/Orbits: Unremarkable. Other: None. IMPRESSION: Normal head CT. Electronically Signed   By: Drusilla Kanner M.D.   On: 03/23/2016 18:28   Dg Chest Portable 1 View  Result Date: 03/23/2016 CLINICAL DATA:  Headache, vomiting EXAM: PORTABLE CHEST 1 VIEW COMPARISON:  06/14/2015 FINDINGS: There is hazy infiltrate/pneumonia right base medially. No pulmonary edema. Bony thorax is unremarkable. IMPRESSION: Hazy infiltrate/ pneumonia right base medially. Follow-up to resolution is recommended. Electronically Signed   By: Natasha Mead M.D.   On: 03/23/2016 15:46    EKG:   Orders placed or performed during the hospital encounter of 03/23/16  . ED EKG 12-Lead  . ED EKG 12-Lead      Management plans discussed with the patient, family and they are in agreement.  CODE STATUS:     Code Status Orders        Start     Ordered   03/23/16 2353  Full code  Continuous     03/23/16 2352    Code Status History    Date Active Date Inactive Code Status Order ID Comments User Context   This patient has a current code status but no historical code status.      TOTAL TIME TAKING CARE OF THIS PATIENT: 35 minutes.    Altamese Dilling M.D on 03/25/2016 at 2:46 PM  Between 7am to 6pm - Pager -  [phone]  After 6pm go to [email or web address] - password [email or web address]  [facility]  Office  [phone]  CC: Primary care physician; Evelene Croon, MD   Note: This dictation was prepared with Dragon dictation along with smaller phrase technology. Any transcriptional errors that result from this process are unintentional.

## 2016-03-25 NOTE — Discharge Instructions (Signed)
Follow with PMD to check on blood culture report.

## 2016-03-25 NOTE — Progress Notes (Signed)
Discharge instructions given and went over with patient and family at bedside. Prescriptions given. All questions answered. Patient discharged home. Bo McclintockBrewer,Janecia Palau S, RN

## 2016-03-26 LAB — CULTURE, BLOOD (SINGLE)

## 2016-03-27 LAB — CSF CULTURE: CULTURE: NO GROWTH

## 2016-03-27 LAB — CSF CULTURE W GRAM STAIN

## 2016-03-29 LAB — CULTURE, BLOOD (ROUTINE X 2)
CULTURE: NO GROWTH
Culture: NO GROWTH

## 2017-02-23 ENCOUNTER — Emergency Department
Admission: EM | Admit: 2017-02-23 | Discharge: 2017-02-23 | Disposition: A | Discharge status: Home / Self Care | Payer: 59 | Attending: Emergency Medicine | Admitting: Emergency Medicine

## 2017-02-23 ENCOUNTER — Encounter: Payer: Self-pay | Admitting: Emergency Medicine

## 2017-02-23 DIAGNOSIS — Y92009 Unspecified place in unspecified non-institutional (private) residence as the place of occurrence of the external cause: Secondary | ICD-10-CM | POA: Insufficient documentation

## 2017-02-23 DIAGNOSIS — S0501XA Injury of conjunctiva and corneal abrasion without foreign body, right eye, initial encounter: Secondary | ICD-10-CM

## 2017-02-23 DIAGNOSIS — Y999 Unspecified external cause status: Secondary | ICD-10-CM | POA: Insufficient documentation

## 2017-02-23 DIAGNOSIS — Y939 Activity, unspecified: Secondary | ICD-10-CM | POA: Insufficient documentation

## 2017-02-23 DIAGNOSIS — W208XXA Other cause of strike by thrown, projected or falling object, initial encounter: Secondary | ICD-10-CM | POA: Insufficient documentation

## 2017-02-23 DIAGNOSIS — S0083XA Contusion of other part of head, initial encounter: Secondary | ICD-10-CM

## 2017-02-23 MED ORDER — TOBRAMYCIN 0.3 % OP SOLN: 2.0000 [drp] | OPHTHALMIC | 0 refills | Status: DC

## 2017-02-23 MED ORDER — FLUORESCEIN SODIUM 0.6 MG OP STRP
1.0000 | ORAL_STRIP | Freq: Once | OPHTHALMIC | Status: AC
  Administered 2017-02-23: 1 via OPHTHALMIC
  Filled 2017-02-23: qty 1

## 2017-02-23 MED ORDER — EYE WASH OPHTH SOLN
1.0000 [drp] | OPHTHALMIC | Status: DC | PRN
  Administered 2017-02-23: 1 [drp] via OPHTHALMIC
  Filled 2017-02-23: qty 118

## 2017-02-23 MED ORDER — TETRACAINE HCL 0.5 % OP SOLN
1.0000 [drp] | Freq: Once | OPHTHALMIC | Status: AC
  Administered 2017-02-23: 1 [drp] via OPHTHALMIC
  Filled 2017-02-23: qty 4
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
## 2017-02-23 NOTE — Discharge Instructions (Signed)
Call [facility] if any continued problems with your eye tomorrow morning. Use eyedrops as directed every 4 hours while you are awake. Wear sunglasses when sensitive to bright light.
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
## 2017-04-12 ENCOUNTER — Emergency Department
Admission: EM | Admit: 2017-04-12 | Discharge: 2017-04-12 | Disposition: A | Discharge status: Home / Self Care | Payer: Self-pay | Attending: Emergency Medicine | Admitting: Emergency Medicine

## 2017-04-12 ENCOUNTER — Encounter: Payer: Self-pay | Admitting: Emergency Medicine

## 2017-04-12 DIAGNOSIS — J069 Acute upper respiratory infection, unspecified: Secondary | ICD-10-CM | POA: Insufficient documentation

## 2017-04-12 LAB — URINALYSIS, COMPLETE (UACMP) WITH MICROSCOPIC
BACTERIA UA: NONE SEEN
Bilirubin Urine: NEGATIVE
Glucose, UA: NEGATIVE mg/dL
HGB URINE DIPSTICK: NEGATIVE
Ketones, ur: NEGATIVE mg/dL
Leukocytes, UA: NEGATIVE
NITRITE: NEGATIVE
PROTEIN: NEGATIVE mg/dL
SPECIFIC GRAVITY, URINE: 1.021 (ref 1.005–1.030)
pH: 5 (ref 5.0–8.0)

## 2017-04-12 MED ORDER — BENZONATATE 100 MG PO CAPS: 100.0000 mg | ORAL_CAPSULE | Freq: Three times a day (TID) | ORAL | 0 refills | Status: DC | PRN

## 2017-04-12 MED ORDER — GUAIFENESIN ER 600 MG PO TB12
600.0000 mg | ORAL_TABLET | Freq: Two times a day (BID) | ORAL | Status: DC
  Administered 2017-04-12: 600 mg via ORAL
  Filled 2017-04-12 (×2): qty 1

## 2017-04-12 MED ORDER — DEXTROMETHORPHAN POLISTIREX ER 30 MG/5ML PO SUER
30.0000 mg | Freq: Two times a day (BID) | ORAL | Status: DC
  Administered 2017-04-12: 30 mg via ORAL
  Filled 2017-04-12 (×2): qty 5

## 2017-04-12 MED ORDER — FLUTICASONE PROPIONATE 50 MCG/ACT NA SUSP: 2.0000 | Freq: Every day | NASAL | 0 refills | Status: DC

## 2017-04-12 MED ORDER — DM-GUAIFENESIN ER 30-600 MG PO TB12: 1.0000 | ORAL_TABLET | Freq: Two times a day (BID) | ORAL | Status: DC

## 2017-04-12 NOTE — ED Provider Notes (Signed)
[facility] Emergency Department Provider Note ____________________________________________  Time seen: 1220  I have reviewed the triage vital signs and the nursing notes.  HISTORY  Chief Complaint  URI  HPI Bonnie Mcdaniel is a 46 y.o. female presents to the ED for evaluation of a 2-day complaint of sore throat, runny nose, and body aches. Patient denies any interim fevers, chills, chest pain, or shortness of breath.  Past Medical History:  Diagnosis Date  . Medical history non-contributory   . Murmur     Patient Active Problem List   Diagnosis Date Noted  . Sepsis (HCC) 03/23/2016  . Pneumonia 03/23/2016    Past Surgical History:  Procedure Laterality Date  . CESAREAN SECTION      Prior to Admission medications   Medication Sig Start Date End Date Taking? Authorizing Provider  albuterol (PROVENTIL HFA;VENTOLIN HFA) 108 (90 Base) MCG/ACT inhaler Inhale 2 puffs into the lungs every 6 (six) hours as needed for wheezing or shortness of breath (cough). Patient not taking: Reported on 04/12/2017 03/25/16   Altamese DillingVachhani, Vaibhavkumar, MD  benzonatate (TESSALON PERLES) 100 MG capsule Take 1 capsule (100 mg total) by mouth 3 (three) times daily as needed for cough (Take 1-2 per dose). 04/12/17   Chatara Lucente, Charlesetta IvoryJenise V Bacon, PA-C  fluticasone (FLONASE) 50 MCG/ACT nasal spray Place 2 sprays into both nostrils daily. 04/12/17   Daija Routson, Charlesetta IvoryJenise V Bacon, PA-C  tobramycin (TOBREX) 0.3 % ophthalmic solution Place 2 drops into the right eye every 4 (four) hours. While awake Patient not taking: Reported on 04/12/2017 02/23/17   Tommi RumpsSummers, Rhonda L, PA-C    Allergies Patient has no known allergies.  Family History  Problem Relation Age of Onset  . Hypertension Mother     Social History Social History  Substance Use Topics  . Smoking status: Never Smoker  . Smokeless tobacco: Never Used  . Alcohol use No    Review of Systems  Constitutional: Negative for  fever. Eyes: Negative for visual changes. ENT: Positive for sore throat. Nasal drainage Cardiovascular: Negative for chest pain. Respiratory: Negative for shortness of breath. Gastrointestinal: Negative for abdominal pain, vomiting and diarrhea. Genitourinary: Negative for dysuria. Musculoskeletal: Negative for back pain. Generalized bodyaches Skin: Negative for rash. Neurological: Negative for headaches, focal weakness or numbness. ____________________________________________  PHYSICAL EXAM:  VITAL SIGNS: ED Triage Vitals  Enc Vitals Group     BP 04/12/17 1150 (!) 161/93     Pulse Rate 04/12/17 1150 65     Resp 04/12/17 1150 16     Temp 04/12/17 1150 98.2 F (36.8 C)     Temp Source 04/12/17 1150 Oral     SpO2 04/12/17 1150 100 %     Weight 04/12/17 1148 180 lb (81.6 kg)     Height 04/12/17 1148 5\' 8"  (1.727 m)     Head Circumference --      Peak Flow --      Pain Score 04/12/17 1148 0     Pain Loc --      Pain Edu? --      Excl. in GC? --     Constitutional: Alert and oriented. Well appearing and in no distress. Head: Normocephalic and atraumatic. Eyes: Conjunctivae are normal. PERRL. Normal extraocular movements Ears: Canals clear. TMs intact bilaterally. Nose: No congestion/rhinorrhea/epistaxis. Mouth/Throat: Mucous membranes are moist. Uvula is midline and tonsils are flat. No oropharyngeal lesions are appreciated. Neck: Supple. No thyromegaly. Hematological/Lymphatic/Immunological: No cervical lymphadenopathy. Cardiovascular: Normal rate, regular rhythm. Normal distal pulses.  Respiratory: Normal respiratory effort. No wheezes/rales/rhonchi. Gastrointestinal: Soft and nontender. No distention. Musculoskeletal: Nontender with normal range of motion in all extremities.  Neurologic:  Normal gait without ataxia. Normal speech and language. No gross focal neurologic deficits are appreciated. Skin:  Skin is warm, dry and intact. No rash  noted. ____________________________________________  PROCEDURES  Guaifenesin 600 mg PO Dextromethorphan elixir 30 mg PO ____________________________________________  INITIAL IMPRESSION / ASSESSMENT AND PLAN / ED COURSE  Patient ED evaluation of injuries include a 2 day complaint of sore throat, runny nose, generalized bodyaches. No indication of any acute respiratory distress. Patient's exam is overall benign. She is not on Associates for a strep pharyngitis at this time. She likely has a viral etiology related to her URI symptoms. She will start on Tessalon Perles and Flonase. She should also dose OTC Delsym for additional cough relief. Follow-up with her primary care provider or return to the ED as needed. ____________________________________________  FINAL CLINICAL IMPRESSION(S) / ED DIAGNOSES  Final diagnoses:  Viral upper respiratory tract infection      Karmen Stabs, Charlesetta Ivory, PA-C 04/12/17 Sherol Dade, [place], MD 04/13/17 2316

## 2017-04-12 NOTE — Discharge Instructions (Signed)
Your exam and labs are normal at this time. Take the prescription meds as directed. Follow-up with your provider as needed.

## 2017-04-12 NOTE — ED Triage Notes (Signed)
Sore throat , runny nose, body aches x 2 days.

## 2018-06-26 ENCOUNTER — Emergency Department
Admission: EM | Admit: 2018-06-26 | Discharge: 2018-06-26 | Disposition: A | Discharge status: Home / Self Care | Payer: Self-pay | Attending: Emergency Medicine | Admitting: Emergency Medicine

## 2018-06-26 ENCOUNTER — Encounter: Payer: Self-pay | Admitting: Emergency Medicine

## 2018-06-26 DIAGNOSIS — Z79899 Other long term (current) drug therapy: Secondary | ICD-10-CM | POA: Insufficient documentation

## 2018-06-26 DIAGNOSIS — H60332 Swimmer's ear, left ear: Secondary | ICD-10-CM | POA: Insufficient documentation

## 2018-06-26 MED ORDER — CIPROFLOXACIN-DEXAMETHASONE 0.3-0.1 % OT SUSP: 4.0000 [drp] | Freq: Two times a day (BID) | OTIC | 0 refills | Status: AC

## 2018-06-26 NOTE — ED Provider Notes (Signed)
[facility] Emergency Department Provider Note  ____________________________________________  Time seen: Approximately 3:43 PM  I have reviewed the triage vital signs and the nursing notes.   HISTORY  Chief Complaint Otalgia    HPI Bonnie Mcdaniel is a 47 y.o. female presents to the emergency department with left ear pain for the past 2 days that is reproducible with palpation of the tragus.  Patient denies left ear discharge.  Patient reports that she has been wearing an ear piece at work and associates onset of symptoms with usage of ear piece.  Patient denies fever and chills at home.  No associated rhinorrhea, congestion or nonproductive cough. No alleviating measures have been attempted.    Past Medical History:  Diagnosis Date  . Medical history non-contributory   . Murmur     Patient Active Problem List   Diagnosis Date Noted  . Sepsis (HCC) 03/23/2016  . Pneumonia 03/23/2016    Past Surgical History:  Procedure Laterality Date  . CESAREAN SECTION      Prior to Admission medications   Medication Sig Start Date End Date Taking? Authorizing Provider  albuterol (PROVENTIL HFA;VENTOLIN HFA) 108 (90 Base) MCG/ACT inhaler Inhale 2 puffs into the lungs every 6 (six) hours as needed for wheezing or shortness of breath (cough). Patient not taking: Reported on 04/12/2017 03/25/16   Altamese DillingVachhani, Vaibhavkumar, MD  benzonatate (TESSALON PERLES) 100 MG capsule Take 1 capsule (100 mg total) by mouth 3 (three) times daily as needed for cough (Take 1-2 per dose). 04/12/17   Menshew, Charlesetta IvoryJenise V Bacon, PA-C  ciprofloxacin-dexamethasone (CIPRODEX) OTIC suspension Place 4 drops into the left ear 2 (two) times daily for 7 days. 06/26/18 07/03/18  Orvil FeilWoods, Carletta Feasel M, PA-C  fluticasone (FLONASE) 50 MCG/ACT nasal spray Place 2 sprays into both nostrils daily. 04/12/17   Menshew, Charlesetta IvoryJenise V Bacon, PA-C  tobramycin (TOBREX) 0.3 % ophthalmic solution Place 2 drops into the right eye  every 4 (four) hours. While awake Patient not taking: Reported on 04/12/2017 02/23/17   Tommi RumpsSummers, Rhonda L, PA-C    Allergies Patient has no known allergies.  Family History  Problem Relation Age of Onset  . Hypertension Mother     Social History Social History   Tobacco Use  . Smoking status: Never Smoker  . Smokeless tobacco: Never Used  Substance Use Topics  . Alcohol use: No  . Drug use: No     Review of Systems  Constitutional: No fever/chills Eyes: No visual changes. No discharge ENT: Patient has left ear pain.  Cardiovascular: no chest pain. Respiratory: no cough. No SOB. Gastrointestinal: No abdominal pain.  No nausea, no vomiting.  No diarrhea.  No constipation. Genitourinary: Negative for dysuria. No hematuria Musculoskeletal: Negative for musculoskeletal pain. Skin: Negative for rash, abrasions, lacerations, ecchymosis. Neurological: Negative for headaches, focal weakness or numbness.   ____________________________________________   PHYSICAL EXAM:  VITAL SIGNS: ED Triage Vitals  Enc Vitals Group     BP 06/26/18 1451 (!) 141/98     Pulse Rate 06/26/18 1451 72     Resp 06/26/18 1451 20     Temp 06/26/18 1451 97.6 F (36.4 C)     Temp Source 06/26/18 1451 Oral     SpO2 06/26/18 1451 100 %     Weight 06/26/18 1456 160 lb (72.6 kg)     Height 06/26/18 1456 5\' 8"  (1.727 m)     Head Circumference --      Peak Flow --  Pain Score 06/26/18 1456 4     Pain Loc --      Pain Edu? --      Excl. in GC? --      Constitutional: Alert and oriented. Well appearing and in no acute distress. Eyes: Conjunctivae are normal. PERRL. EOMI. Head: Atraumatic. ENT:      Ears: Patient's left ear pain is reproduced with palpation of the tragus.  Left TM is mildly effused but not bulging or erythematous.  Right TM is pearly.      Nose: No congestion/rhinnorhea.      Mouth/Throat: Mucous membranes are moist.  Neck: No stridor.  No cervical spine tenderness to  palpation. Cardiovascular: Normal rate, regular rhythm. Normal S1 and S2.  Good peripheral circulation. Respiratory: Normal respiratory effort without tachypnea or retractions. Lungs CTAB. Good air entry to the bases with no decreased or absent breath sounds. Musculoskeletal: Full range of motion to all extremities. No gross deformities appreciated. Neurologic:  Normal speech and language. No gross focal neurologic deficits are appreciated.  Skin:  Skin is warm, dry and intact. No rash noted. Psychiatric: Mood and affect are normal. Speech and behavior are normal. Patient exhibits appropriate insight and judgement.   ____________________________________________   LABS (all labs ordered are listed, but only abnormal results are displayed)  Labs Reviewed - No data to display ____________________________________________  EKG   ____________________________________________  RADIOLOGY  No results found.  ____________________________________________    PROCEDURES  Procedure(s) performed:    Procedures    Medications - No data to display   ____________________________________________   INITIAL IMPRESSION / ASSESSMENT AND PLAN / ED COURSE  Pertinent labs & imaging results that were available during my care of the patient were reviewed by me and considered in my medical decision making (see chart for details).  Review of the [place] CSRS was performed in accordance of the NCM[facility] prior to dispensing any controlled drugs.      Assessment and Plan:  Otitis externa Patient presents to the emergency department with 1 to 2 days of left ear pain.  On physical exam, patient had tenderness with palpation of the tragus and manipulation of the external auditory canal consistent with otitis externa.  Patient was discharged with Ciprodex.  She was advised to follow-up with primary care as needed.  All patient questions were  answered.    ____________________________________________  FINAL CLINICAL IMPRESSION(S) / ED DIAGNOSES  Final diagnoses:  Acute swimmer's ear of left side      NEW MEDICATIONS STARTED DURING THIS VISIT:  ED Discharge Orders         Ordered    ciprofloxacin-dexamethasone (CIPRODEX) OTIC suspension  2 times daily     06/26/18 1520              This chart was dictated using voice recognition software/Dragon. Despite best efforts to proofread, errors can occur which can change the meaning. Any change was purely unintentional.    Orvil FeilWoods, Dazha Kempa M, PA-C 06/26/18 1603    Emily FilbertWilliams, Jonathan E, MD 06/26/18 [phone]

## 2019-01-01 ENCOUNTER — Telehealth: Payer: Self-pay | Admitting: *Deleted

## 2019-01-01 ENCOUNTER — Other Ambulatory Visit: Payer: Self-pay

## 2019-01-01 DIAGNOSIS — Z20822 Contact with and (suspected) exposure to covid-19: Secondary | ICD-10-CM

## 2019-01-01 NOTE — Telephone Encounter (Signed)
Received call from [facility] Department to refer this patient for covid-19 testing.  Pt noitified and scheduled for tomorrow at the [facility] at 9 am. Advised that this a drive thru test site and to stay in car with masks on and windows rolled up until time for testing. She voiced understanding.

## 2019-01-02 ENCOUNTER — Other Ambulatory Visit: Payer: Medicaid Other

## 2019-01-02 NOTE — Telephone Encounter (Signed)
Patient called and says he needs testing and he missed his morning appointment for testing. I advised to go ahead over and he wlll be tested, he says he's leaving now.

## 2019-01-07 LAB — NOVEL CORONAVIRUS, NAA: SARS-CoV-2, NAA: NOT DETECTED

## 2019-04-15 ENCOUNTER — Other Ambulatory Visit: Payer: Self-pay

## 2019-05-21 ENCOUNTER — Emergency Department
Admission: EM | Admit: 2019-05-21 | Discharge: 2019-05-21 | Disposition: A | Discharge status: Home / Self Care | Payer: Medicaid Other | Attending: Emergency Medicine | Admitting: Emergency Medicine

## 2019-05-21 ENCOUNTER — Emergency Department: Payer: Medicaid Other

## 2019-05-21 ENCOUNTER — Encounter: Payer: Self-pay | Admitting: Emergency Medicine

## 2019-05-21 ENCOUNTER — Other Ambulatory Visit: Payer: Self-pay

## 2019-05-21 DIAGNOSIS — N6320 Unspecified lump in the left breast, unspecified quadrant: Secondary | ICD-10-CM | POA: Insufficient documentation

## 2019-05-21 DIAGNOSIS — Z79899 Other long term (current) drug therapy: Secondary | ICD-10-CM | POA: Insufficient documentation

## 2019-05-21 NOTE — Discharge Instructions (Signed)
I did not palpate a breast mass during this emergency department encounter but in order to rule out breast mass, you need a mammogram. Please make follow-up with primary care or [facility] breast center for mammogram.

## 2019-05-21 NOTE — ED Provider Notes (Signed)
Bonnie Mcdaniel  ____________________________________________  Time seen: Approximately 8:40 PM  I have reviewed the triage vital signs and the nursing notes.   HISTORY  Chief Complaint knot on chest    HPI Staphanie L Mcdaniel is a 48 y.o. female presents to the emergency department with concern for a possible breast mass.  Patient states that she noticed an area approximately 2 to 3 days ago.  She denies discharge from the nipple or changes in the skin of the breast.  She denies fever chills or experiencing similar symptoms in the past.  Patient denies night sweats or weight loss.  No personal history of breast cancer.  No other alleviating measures have been attempted.        Past Medical History:  Diagnosis Date  . Medical history non-contributory   . Murmur     Patient Active Problem List   Diagnosis Date Noted  . Sepsis ([place]) 03/23/2016  . Pneumonia 03/23/2016    Past Surgical History:  Procedure Laterality Date  . CESAREAN SECTION      Prior to Admission medications   Medication Sig Start Date End Date Taking? Authorizing Provider  albuterol (PROVENTIL HFA;VENTOLIN HFA) 108 (90 Base) MCG/ACT inhaler Inhale 2 puffs into the lungs every 6 (six) hours as needed for wheezing or shortness of breath (cough). Patient not taking: Reported on 04/12/2017 03/25/16   Vaughan Basta, MD  benzonatate (TESSALON PERLES) 100 MG capsule Take 1 capsule (100 mg total) by mouth 3 (three) times daily as needed for cough (Take 1-2 per dose). 04/12/17   Menshew, Dannielle Karvonen, PA-C  fluticasone (FLONASE) 50 MCG/ACT nasal spray Place 2 sprays into both nostrils daily. 04/12/17   Menshew, Dannielle Karvonen, PA-C  tobramycin (TOBREX) 0.3 % ophthalmic solution Place 2 drops into the right eye every 4 (four) hours. While awake Patient not taking: Reported on 04/12/2017 02/23/17   Johnn Hai, PA-C    Allergies Patient has no known  allergies.  Family History  Problem Relation Age of Onset  . Hypertension Mother     Social History Social History   Tobacco Use  . Smoking status: Never Smoker  . Smokeless tobacco: Never Used  Substance Use Topics  . Alcohol use: No  . Drug use: No     Review of Systems  Constitutional: No fever/chills Eyes: No visual changes. No discharge ENT: No upper respiratory complaints. Cardiovascular: no chest pain.  Patient has concern for mass of the left breast. Respiratory: no cough. No SOB. Gastrointestinal: No abdominal pain.  No nausea, no vomiting.  No diarrhea.  No constipation. Genitourinary: Negative for dysuria. No hematuria Musculoskeletal: Negative for musculoskeletal pain. Skin: Negative for rash, abrasions, lacerations, ecchymosis. Neurological: Negative for headaches, focal weakness or numbness.   ____________________________________________   PHYSICAL EXAM:  VITAL SIGNS: ED Triage Vitals  Enc Vitals Group     BP 05/21/19 1743 (!) 155/116     Pulse Rate 05/21/19 1743 73     Resp 05/21/19 1743 19     Temp 05/21/19 1743 98.7 F (37.1 C)     Temp Source 05/21/19 1743 Oral     SpO2 05/21/19 1743 100 %     Weight 05/21/19 1744 178 lb (80.7 kg)     Height 05/21/19 1744 5\' 8"  (1.727 m)     Head Circumference --      Peak Flow --      Pain Score 05/21/19 1750 0  Pain Loc --      Pain Edu? --      Excl. in GC? --      Constitutional: Alert and oriented. Well appearing and in no acute distress. Eyes: Conjunctivae are normal. PERRL. EOMI. Head: Atraumatic. ENT: Cardiovascular: Normal rate, regular rhythm. Normal S1 and S2.  Good peripheral circulation.  No palpable masses of the left breast.  The affected area of concern is along patient's left sternum which feels symmetrical with the right sternum.  No dimpling of the skin or erythema.  No palpable induration or fluctuance to suggest abscess. Respiratory: Normal respiratory effort without tachypnea or  retractions. Lungs CTAB. Good air entry to the bases with no decreased or absent breath sounds. Gastrointestinal: Bowel sounds 4 quadrants. Soft and nontender to palpation. No guarding or rigidity. No palpable masses. No distention. No CVA tenderness. Musculoskeletal: Full range of motion to all extremities. No gross deformities appreciated. Neurologic:  Normal speech and language. No gross focal neurologic deficits are appreciated.  Skin:  Skin is warm, dry and intact. No rash noted. Psychiatric: Mood and affect are normal. Speech and behavior are normal. Patient exhibits appropriate insight and judgement.   ____________________________________________   LABS (all labs ordered are listed, but only abnormal results are displayed)  Labs Reviewed - No data to display ____________________________________________  EKG   ____________________________________________  RADIOLOGY I personally viewed and evaluated these images as part of my medical decision making, as well as reviewing the written report by the radiologist.  Dg Chest 2 View  Result Date: 05/21/2019 CLINICAL DATA:  Soft tissue fullness left breast region. EXAM: CHEST - 2 VIEW COMPARISON:  March 23, 2016 FINDINGS: Lungs are clear. Heart size and pulmonary vascularity are normal. No adenopathy. No bone or soft tissue lesions are evident. No pneumothorax. IMPRESSION: No abnormality noted. In particular, no soft tissue masses evident by radiography. Electronically Signed   By: Bretta BangWilliam  Woodruff III M.D.   On: 05/21/2019 18:24    ____________________________________________    PROCEDURES  Procedure(s) performed:    Procedures    Medications - No data to display   ____________________________________________   INITIAL IMPRESSION / ASSESSMENT AND PLAN / ED COURSE  Pertinent labs & imaging results that were available during my care of the patient were reviewed by me and considered in my medical decision making  (see chart for details).  Review of the [place] CSRS was performed in accordance of the NCM[facility] prior to dispensing any controlled drugs.         Assessment and plan Breast mass, left. 48 year old female presents to the emergency department with concern for left breast mass.  I could not palpate an identifiable mass in affected area of concern felt symmetrical with right-sided sternal.  I conveyed to patient that she would need a mammogram to rule out mass.  She voiced understanding.  I gave her resources for the normal breast center.  Chest x-ray revealed no concerning findings.  There was no dimpling of the skin or erythema which would suggest an underlying process.  Patient was hypertensive at triage but vital signs were otherwise reassuring.  All patient questions were answered.    ____________________________________________  FINAL CLINICAL IMPRESSION(S) / ED DIAGNOSES  Final diagnoses:  Breast mass, left      NEW MEDICATIONS STARTED DURING THIS VISIT:  ED Discharge Orders    None          This chart was dictated using voice recognition software/Dragon. Despite best efforts to proofread, errors  can occur which can change the meaning. Any change was purely unintentional.    Orvil Feil, PA-C 05/21/19 2044    Dionne Bucy, MD 05/21/19 2238

## 2019-05-21 NOTE — ED Triage Notes (Signed)
Patient noticed a knot on chest.  States it has been there for a while.  No SOB/ DOE.  Skin warm and dry.

## 2019-09-28 ENCOUNTER — Other Ambulatory Visit: Payer: Self-pay

## 2019-09-28 ENCOUNTER — Encounter: Payer: Self-pay | Admitting: Emergency Medicine

## 2019-09-28 ENCOUNTER — Emergency Department: Payer: Medicaid Other

## 2019-09-28 ENCOUNTER — Emergency Department
Admission: EM | Admit: 2019-09-28 | Discharge: 2019-09-28 | Disposition: A | Discharge status: Home / Self Care | Payer: Medicaid Other | Attending: Student | Admitting: Student

## 2019-09-28 DIAGNOSIS — R600 Localized edema: Secondary | ICD-10-CM

## 2019-09-28 DIAGNOSIS — Z79899 Other long term (current) drug therapy: Secondary | ICD-10-CM | POA: Insufficient documentation

## 2019-09-28 DIAGNOSIS — N390 Urinary tract infection, site not specified: Secondary | ICD-10-CM | POA: Insufficient documentation

## 2019-09-28 DIAGNOSIS — R809 Proteinuria, unspecified: Secondary | ICD-10-CM | POA: Insufficient documentation

## 2019-09-28 DIAGNOSIS — I10 Essential (primary) hypertension: Secondary | ICD-10-CM | POA: Insufficient documentation

## 2019-09-28 HISTORY — DX: Essential (primary) hypertension: I10

## 2019-09-28 LAB — BASIC METABOLIC PANEL
Anion gap: 9 (ref 5–15)
BUN: 12 mg/dL (ref 6–20)
CO2: 25 mmol/L (ref 22–32)
Calcium: 8.5 mg/dL — ABNORMAL LOW (ref 8.9–10.3)
Chloride: 104 mmol/L (ref 98–111)
Creatinine, Ser: 0.8 mg/dL (ref 0.44–1.00)
GFR calc Af Amer: >60 mL/min (ref >60)
GFR calc non Af Amer: >60 mL/min (ref >60)
Glucose, Bld: 90 mg/dL (ref 70–99)
Potassium: 3.3 mmol/L — ABNORMAL LOW (ref 3.5–5.1)
Sodium: 138 mmol/L (ref 135–145)

## 2019-09-28 LAB — URINALYSIS, COMPLETE (UACMP) WITH MICROSCOPIC
Bilirubin Urine: NEGATIVE
Glucose, UA: NEGATIVE mg/dL
Hgb urine dipstick: NEGATIVE
Ketones, ur: NEGATIVE mg/dL
Nitrite: NEGATIVE
Protein, ur: 30 mg/dL — AB
RBC / HPF: >50 RBC/hpf — ABNORMAL HIGH (ref 0–5)
Specific Gravity, Urine: 1.025 (ref 1.005–1.030)
WBC, UA: >50 WBC/hpf — ABNORMAL HIGH (ref 0–5)
pH: 5 (ref 5.0–8.0)

## 2019-09-28 LAB — BRAIN NATRIURETIC PEPTIDE: B Natriuretic Peptide: 122 pg/mL — ABNORMAL HIGH (ref 0.0–100.0)

## 2019-09-28 LAB — CBC WITH DIFFERENTIAL/PLATELET
Abs Immature Granulocytes: 0.02 10*3/uL (ref 0.00–0.07)
Basophils Absolute: 0 10*3/uL (ref 0.0–0.1)
Basophils Relative: 0 %
Eosinophils Absolute: 0.1 10*3/uL (ref 0.0–0.5)
Eosinophils Relative: 1 %
HCT: 37.7 % (ref 36.0–46.0)
Hemoglobin: 11.9 g/dL — ABNORMAL LOW (ref 12.0–15.0)
Immature Granulocytes: 0 %
Lymphocytes Relative: 18 %
Lymphs Abs: 1.4 10*3/uL (ref 0.7–4.0)
MCH: 29.9 pg (ref 26.0–34.0)
MCHC: 31.6 g/dL (ref 30.0–36.0)
MCV: 94.7 fL (ref 80.0–100.0)
Monocytes Absolute: 0.4 10*3/uL (ref 0.1–1.0)
Monocytes Relative: 6 %
Neutro Abs: 5.8 10*3/uL (ref 1.7–7.7)
Neutrophils Relative %: 75 %
Platelets: 277 10*3/uL (ref 150–400)
RBC: 3.98 MIL/uL (ref 3.87–5.11)
RDW: 15.2 % (ref 11.5–15.5)
WBC: 7.8 10*3/uL (ref 4.0–10.5)
nRBC: 0 % (ref 0.0–0.2)

## 2019-09-28 LAB — HEPATIC FUNCTION PANEL
ALT: 16 U/L (ref 0–44)
AST: 17 U/L (ref 15–41)
Albumin: 3.2 g/dL — ABNORMAL LOW (ref 3.5–5.0)
Alkaline Phosphatase: 43 U/L (ref 38–126)
Bilirubin, Direct: <0.1 mg/dL (ref 0.0–0.2)
Total Bilirubin: 0.4 mg/dL (ref 0.3–1.2)
Total Protein: 6.1 g/dL — ABNORMAL LOW (ref 6.5–8.1)

## 2019-09-28 MED ORDER — CEPHALEXIN 500 MG PO CAPS: 500.0000 mg | ORAL_CAPSULE | Freq: Two times a day (BID) | ORAL | 0 refills | Status: DC

## 2019-09-28 MED ORDER — SODIUM CHLORIDE 0.9 % IV SOLN
1.0000 g | Freq: Once | INTRAVENOUS | Status: AC
  Administered 2019-09-28: 12:00:00 1 g via INTRAVENOUS
  Filled 2019-09-28: qty 10

## 2019-09-28 NOTE — ED Notes (Signed)
Pt transported to US

## 2019-09-28 NOTE — Discharge Instructions (Signed)
Thank you for letting us take care of you in the emergency department today. Your work-up today showed that you have a urine infection and a small amount of protein in the urine. For this, we will treat you with antibiotics and advised following up with your regular doctor as well as a kidney doctor.  Please continue to take any regular, prescribed medications.   New medications we have prescribed:  Keflex, antibiotic for urine infection  Please follow up with: Your primary care doctor to review your ER visit and follow up on your symptoms.  Kidney doctor, information below  Please return to the ER for any new or worsening symptoms.

## 2019-09-28 NOTE — ED Triage Notes (Signed)
Pt to ED via POV for Bilateral lower leg and ankle swelling. Pt states that she noticed this a few days ago but it has gotten worse. Pt states that she tried elevating her legs but it did not help. Denies shortness of breath. Pt states that she has hx/o heart murmur and HTN. Denies hx/o CHF. Pt is in NAD.

## 2019-09-28 NOTE — ED Provider Notes (Signed)
[facility] Emergency Department Provider Note  ____________________________________________   First MD Initiated Contact with Patient 09/28/19 [phone]     (approximate)  I have reviewed the triage vital signs and the nursing notes.  History  Chief Complaint Leg Swelling    HPI Bonnie Mcdaniel is a 49 y.o. female past medical history as below, who presents to the emergency department for bilateral lower extremity swelling.  Patient states symptoms first started 4 days ago and have been constant since onset and progressively worsening.  She states her legs feel heavy because of the swelling, and is experiencing some discomfort from this.  She describes this as a tightness or pressure from the swelling, 8/10 in severity, no radiation. She has been elevating her legs which have not improved her symptoms.  She denies any history of similar symptoms.  No history of heart failure, liver disease, or kidney disease. Does report changes in the appearance of her urine, but denies dysuria.  Denies any history of DVT or VTE.  No history of prolonged immobilization or hormonal medication use.  No trauma.    She denies any chest pain or shortness of breath.  Denies any recent changes to her daily routine, no dietary changes, no medication changes.   Past Medical Hx Past Medical History:  Diagnosis Date  . Hypertension   . Medical history non-contributory   . Murmur     Problem List Patient Active Problem List   Diagnosis Date Noted  . Sepsis (HCC) 03/23/2016  . Pneumonia 03/23/2016    Past Surgical Hx Past Surgical History:  Procedure Laterality Date  . CESAREAN SECTION      Medications Prior to Admission medications   Medication Sig Start Date End Date Taking? Authorizing Provider  albuterol (PROVENTIL HFA;VENTOLIN HFA) 108 (90 Base) MCG/ACT inhaler Inhale 2 puffs into the lungs every 6 (six) hours as needed for wheezing or shortness of breath (cough). Patient  not taking: Reported on 04/12/2017 03/25/16   Altamese Dilling, MD  benzonatate (TESSALON PERLES) 100 MG capsule Take 1 capsule (100 mg total) by mouth 3 (three) times daily as needed for cough (Take 1-2 per dose). 04/12/17   Menshew, Charlesetta Ivory, PA-C  cephALEXin (KEFLEX) 500 MG capsule Take 1 capsule (500 mg total) by mouth 2 (two) times daily for 7 days. 09/28/19 10/05/19  Miguel Aschoff., MD  fluticasone (FLONASE) 50 MCG/ACT nasal spray Place 2 sprays into both nostrils daily. 04/12/17   Menshew, Charlesetta Ivory, PA-C  tobramycin (TOBREX) 0.3 % ophthalmic solution Place 2 drops into the right eye every 4 (four) hours. While awake Patient not taking: Reported on 04/12/2017 02/23/17   Tommi Rumps, PA-C    Allergies Patient has no known allergies.  Family Hx Family History  Problem Relation Age of Onset  . Hypertension Mother     Social Hx Social History   Tobacco Use  . Smoking status: Never Smoker  . Smokeless tobacco: Never Used  Substance Use Topics  . Alcohol use: No  . Drug use: No     Review of Systems  Constitutional: Negative for fever. Negative for chills. Eyes: Negative for visual changes. ENT: Negative for sore throat. Cardiovascular: Negative for chest pain. Respiratory: Negative for shortness of breath. Gastrointestinal: Negative for nausea. Negative for vomiting.  Genitourinary: Negative for dysuria. Musculoskeletal: Positive for leg swelling. Skin: Negative for rash. Neurological: Negative for headaches.   Physical Exam  Vital Signs: ED Triage Vitals  Enc Vitals Group  BP 09/28/19 0715 (!) 148/86     Pulse Rate 09/28/19 0715 74     Resp 09/28/19 0715 16     Temp 09/28/19 0715 98.4 F (36.9 C)     Temp Source 09/28/19 0715 Oral     SpO2 09/28/19 0715 100 %     Weight 09/28/19 0717 175 lb (79.4 kg)     Height 09/28/19 0717 5\' 8"  (1.727 m)     Head Circumference --      Peak Flow --      Pain Score 09/28/19 0716 8     Pain Loc --        Pain Edu? --      Excl. in [place]? --     Constitutional: Alert and oriented. Well appearing. NAD.  Head: Normocephalic. Atraumatic. Eyes: Conjunctivae clear. Sclera anicteric. Pupils equal and symmetric. Nose: No masses or lesions. No congestion or rhinorrhea. Mouth/Throat: Wearing mask.  Neck: No stridor. Trachea midline.  Cardiovascular: Normal rate, regular rhythm. Extremities well perfused. Respiratory: Normal respiratory effort.  Lungs CTAB. Gastrointestinal: Soft. Non-distended. Non-tender.  Genitourinary: Deferred. Musculoskeletal: Bilateral lower extremity pitting edema to the level of the knee, right slightly greater than left.  No pain with palpation of the calf.  No warmth, induration, or erythema. Neurologic:  Normal speech and language. No gross focal or lateralizing neurologic deficits are appreciated.  Skin: Skin is warm, dry and intact. No rash noted. Psychiatric: Mood and affect are appropriate for situation.  EKG  Personally reviewed and interpreted by myself.   Date: 10/25/2019 Time: 7:21 AM Rate: 69 Rhythm: Sinus Axis: Normal Intervals: Within normal limits LVH No STEMI    Radiology  Personally reviewed available imaging myself.   Ultrasound venous bilateral lower extremity IMPRESSION:  No evidence of deep venous thrombosis in either lower extremity.    Procedures  Procedure(s) performed (including critical care):  Procedures   Initial Impression / Assessment and Plan / MDM / ED Course  49 y.o. female who presents to the ED for bilateral lower extremity swelling x 4 days.  Ddx: DVT, low albumin, renal disease, venous insufficiency. Consider new onset HF - however this seems less likely as the patient denies any shortness of breath or respiratory symptoms. Patient is not on any CCB, less likely to be a medication side effect.  No evidence of skin or soft tissue infection by exam.  Will plan for labs, urine studies, ultrasound  imaging.  Clinical Course as of Sep 27 1798  Sat Sep 28, 2019  1151 Ultrasound negative for DVT. UA indicative of infection, mild amount of proteinuria. Normal creatinine. Albumin only minimally decreased. Plan for treatment of UTI, compression wrappings for symptomatic control, and outpatient PCP + nephrology follow-up. Patient voices understanding and is comfortable w/ plan and discharge.   [SM]    Clinical Course User Index [SM] Lilia Pro., MD     _______________________________   As part of my medical decision making I have reviewed available labs, radiology tests, reviewed old records/chart review.     Final Clinical Impression(s) / ED Diagnosis  Final diagnoses:  Bilateral lower extremity edema  Proteinuria, unspecified type  Urinary tract infection in female       Note:  This document was prepared using Dragon voice recognition software and may include unintentional dictation errors.   Lilia Pro., MD 09/28/19 1800

## 2019-09-28 NOTE — ED Notes (Signed)
Wrapped pt's bilateral legs in ace bandages. Pt ambulatory upon discharge.

## 2019-09-28 NOTE — ED Notes (Signed)
Pt given meal tray and water with approval from Dr. Colon Branch, MD

## 2019-10-04 ENCOUNTER — Other Ambulatory Visit: Payer: Self-pay

## 2019-10-04 ENCOUNTER — Emergency Department: Payer: Medicaid Other

## 2019-10-04 ENCOUNTER — Emergency Department
Admission: EM | Admit: 2019-10-04 | Discharge: 2019-10-04 | Disposition: A | Discharge status: Home / Self Care | Payer: Medicaid Other | Attending: Emergency Medicine | Admitting: Emergency Medicine

## 2019-10-04 ENCOUNTER — Encounter: Payer: Self-pay | Admitting: Emergency Medicine

## 2019-10-04 DIAGNOSIS — Z79899 Other long term (current) drug therapy: Secondary | ICD-10-CM | POA: Insufficient documentation

## 2019-10-04 DIAGNOSIS — Y929 Unspecified place or not applicable: Secondary | ICD-10-CM | POA: Insufficient documentation

## 2019-10-04 DIAGNOSIS — Y999 Unspecified external cause status: Secondary | ICD-10-CM | POA: Insufficient documentation

## 2019-10-04 DIAGNOSIS — S6991XA Unspecified injury of right wrist, hand and finger(s), initial encounter: Secondary | ICD-10-CM | POA: Insufficient documentation

## 2019-10-04 DIAGNOSIS — W010XXA Fall on same level from slipping, tripping and stumbling without subsequent striking against object, initial encounter: Secondary | ICD-10-CM | POA: Insufficient documentation

## 2019-10-04 DIAGNOSIS — I10 Essential (primary) hypertension: Secondary | ICD-10-CM | POA: Insufficient documentation

## 2019-10-04 DIAGNOSIS — Y9389 Activity, other specified: Secondary | ICD-10-CM | POA: Insufficient documentation

## 2019-10-04 MED ORDER — KETOROLAC TROMETHAMINE 30 MG/ML IJ SOLN
30.0000 mg | Freq: Once | INTRAMUSCULAR | Status: AC
  Administered 2019-10-04: 17:00:00 30 mg via INTRAMUSCULAR
  Filled 2019-10-04: qty 1

## 2019-10-04 MED ORDER — IBUPROFEN 600 MG PO TABS: 600.0000 mg | ORAL_TABLET | Freq: Four times a day (QID) | ORAL | 0 refills | Status: DC | PRN

## 2019-10-04 NOTE — ED Triage Notes (Signed)
Pt to ED via POV for right hand pain and swelling. Pt states that she tripped and fell. Pt states that she tried to catch herself. Right hand is now painful and swelling. Pt is in NAD.
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
## 2019-10-04 NOTE — ED Notes (Signed)
E-signature pad would not load at time of discharge, pt anxious to leave. Discharge paperwork and education given, pt voices understanding

## 2019-10-04 NOTE — ED Notes (Signed)
See triage note  Presents with pain and swelling to right hand  States she fell yesterday  Tried to catch herself with her right hand  Pain,redness and some swelling noted to right 4 th finger and lateral hand

## 2019-10-05 ENCOUNTER — Ambulatory Visit: Payer: Medicaid Other

## 2020-08-08 ENCOUNTER — Emergency Department: Payer: Medicaid Other

## 2020-08-08 ENCOUNTER — Emergency Department
Admission: EM | Admit: 2020-08-08 | Discharge: 2020-08-08 | Disposition: A | Discharge status: Home / Self Care | Payer: Medicaid Other | Attending: Emergency Medicine | Admitting: Emergency Medicine

## 2020-08-08 ENCOUNTER — Encounter: Payer: Self-pay | Admitting: Emergency Medicine

## 2020-08-08 DIAGNOSIS — W2209XA Striking against other stationary object, initial encounter: Secondary | ICD-10-CM | POA: Insufficient documentation

## 2020-08-08 DIAGNOSIS — I10 Essential (primary) hypertension: Secondary | ICD-10-CM | POA: Insufficient documentation

## 2020-08-08 DIAGNOSIS — S2231XA Fracture of one rib, right side, initial encounter for closed fracture: Secondary | ICD-10-CM | POA: Insufficient documentation

## 2020-08-08 MED ORDER — NAPROXEN 500 MG PO TABS: 500.0000 mg | ORAL_TABLET | Freq: Two times a day (BID) | ORAL | Status: DC

## 2020-08-08 MED ORDER — LIDOCAINE 5 % EX PTCH: 1.0000 | MEDICATED_PATCH | Freq: Two times a day (BID) | CUTANEOUS | 0 refills | Status: AC

## 2020-08-08 MED ORDER — LIDOCAINE 5 % EX PTCH
1.0000 | MEDICATED_PATCH | CUTANEOUS | Status: DC
  Administered 2020-08-08: 1 via TRANSDERMAL
  Filled 2020-08-08: qty 1

## 2020-08-08 MED ORDER — KETOROLAC TROMETHAMINE 60 MG/2ML IM SOLN
30.0000 mg | Freq: Once | INTRAMUSCULAR | Status: AC
  Administered 2020-08-08: 30 mg via INTRAMUSCULAR
  Filled 2020-08-08: qty 2

## 2020-08-08 MED ORDER — HYDROMORPHONE HCL 1 MG/ML IJ SOLN
1.0000 mg | Freq: Once | INTRAMUSCULAR | Status: AC
  Administered 2020-08-08: 1 mg via INTRAMUSCULAR
  Filled 2020-08-08: qty 1

## 2020-08-08 MED ORDER — OXYCODONE-ACETAMINOPHEN 10-325 MG PO TABS: 1.0000 | ORAL_TABLET | Freq: Four times a day (QID) | ORAL | 0 refills | Status: AC | PRN
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
## 2020-08-08 NOTE — ED Notes (Signed)
Pt given phone to call family.
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
## 2020-08-08 NOTE — Discharge Instructions (Signed)
Follow discharge care instruction take medication as directed.  Be advised medication may cause drowsiness. 

## 2020-08-08 NOTE — ED Notes (Signed)
Pt taken for xray

## 2021-09-24 ENCOUNTER — Other Ambulatory Visit: Payer: Self-pay

## 2021-09-24 ENCOUNTER — Emergency Department
Admission: EM | Admit: 2021-09-24 | Discharge: 2021-09-24 | Disposition: A | Discharge status: Home / Self Care | Payer: Medicaid Other | Attending: Emergency Medicine | Admitting: Emergency Medicine

## 2021-09-24 DIAGNOSIS — H04129 Dry eye syndrome of unspecified lacrimal gland: Secondary | ICD-10-CM | POA: Insufficient documentation

## 2021-09-24 DIAGNOSIS — H04123 Dry eye syndrome of bilateral lacrimal glands: Secondary | ICD-10-CM

## 2021-09-24 DIAGNOSIS — H1089 Other conjunctivitis: Secondary | ICD-10-CM | POA: Insufficient documentation

## 2021-09-24 DIAGNOSIS — H109 Unspecified conjunctivitis: Secondary | ICD-10-CM

## 2021-09-24 MED ORDER — POLYMYXIN B-TRIMETHOPRIM 10000-0.1 UNIT/ML-% OP SOLN: 1.0000 [drp] | OPHTHALMIC | 0 refills | Status: AC

## 2021-09-24 MED ORDER — FLUORESCEIN SODIUM 1 MG OP STRP
1.0000 | ORAL_STRIP | Freq: Once | OPHTHALMIC | Status: AC
  Administered 2021-09-24: 1 via OPHTHALMIC
  Filled 2021-09-24: qty 1

## 2021-09-24 MED ORDER — TETRACAINE HCL 0.5 % OP SOLN
1.0000 [drp] | Freq: Once | OPHTHALMIC | Status: AC
  Administered 2021-09-24: 2 [drp] via OPHTHALMIC
  Filled 2021-09-24: qty 4

## 2021-09-24 NOTE — ED Provider Notes (Signed)
? ?[facility] ?Provider Note ? ? ? Event Date/Time  ? First MD Initiated Contact with Patient 09/24/21 1918   ?  (approximate) ? ? ?History  ? ?Eye Pain ? ? ?HPI ? ?Bonnie Mcdaniel is a 51 y.o. female presents to the ER today with complaint of left eye pain, redness and pressure.  She reports this started 2 days ago.  She reports the pain and pressure are constant.  She reports she feels like something is in her eyeball although she does not recall getting anything in her eye.  She reports tearing of the left eye and sensitivity to light.  She denies headache, facial pain and pressure, runny nose, nasal congestion ear pain or sore throat.  She reports she has tried multiple eyedrops OTC with minimal relief of symptoms. ? ?  ? ? ?Physical Exam  ? ?Triage Vital Signs: ?ED Triage Vitals  ?Enc Vitals Group  ?   BP 09/24/21 1817 (!) 180/96  ?   Pulse Rate 09/24/21 1817 69  ?   Resp 09/24/21 1817 16  ?   Temp 09/24/21 1817 98.4 ?F (36.9 ?C)  ?   Temp Source 09/24/21 1817 Oral  ?   SpO2 09/24/21 1817 100 %  ?   Weight 09/24/21 1820 165 lb (74.8 kg)  ?   Height 09/24/21 1820 5\' 8"  (1.727 m)  ?   Head Circumference --   ?   Peak Flow --   ?   Pain Score 09/24/21 1820 10  ?   Pain Loc --   ?   Pain Edu? --   ?   Excl. in [place]? --   ? ? ?Most recent vital signs: ?Vitals:  ? 09/24/21 1817  ?BP: (!) 180/96  ?Pulse: 69  ?Resp: 16  ?Temp: 98.4 ?F (36.9 ?C)  ?SpO2: 100%  ? ? ? ?General: Awake, no distress.  ?ENT:  Left sclera injected, conjunctiva erythematous without exudate noted in conjunctival sac.  EOMs intact.  Eyes are very dry. ?CV:  RRR, no murmur noted. ?Resp:  Normal effort.  CTA bilaterally. ?Neuro:  Alert and oriented. ? ?ED Results / Procedures / Treatments  ? ? ? ?MEDICATIONS ORDERED IN ED: ?Medications  ?tetracaine (PONTOCAINE) 0.5 % ophthalmic solution 1-2 drop (2 drops Left Eye Given by Other 09/24/21 2047)  ?fluorescein ophthalmic strip 1 strip (1 strip Left Eye Given by Other 09/24/21 2048)   ? ? ? ?IMPRESSION / MDM / ASSESSMENT AND PLAN / ED COURSE  ?I reviewed the triage vital signs and the nursing notes. ? ?Left Eye Pain, Redness and Pressure: ? ?Differential diagnosis includes, but is not limited to, viral conjunctivitis, allergic conjunctivitis, bacterial conjunctivitis, corneal abrasion, ocular migraine ? ?Fluorescein stain does not show any evidence of corneal abrasion ?Vision screen: left eye 20/30, right eye 20/30, both eyes 20/25 ?Rx for Polytrim eyedrops 4 times daily while awake x5 days ?Encourage warm compresses 3 times daily ?We will have her follow-up with ophthalmology as an outpatient ? ? ? ? ?FINAL CLINICAL IMPRESSION(S) / ED DIAGNOSES  ? ?Final diagnoses:  ?Dry eyes  ?Bacterial conjunctivitis of left eye  ? ? ? ?Rx / DC Orders  ? ?ED Discharge Orders   ? ?      Ordered  ?  trimethoprim-polymyxin b (POLYTRIM) ophthalmic solution  Every 4 hours       ? 09/24/21 2059  ? ?  ?  ? ?  ? ? ? ?Note:  This document was  prepared using Systems analyst and may include unintentional dictation errors. ? ?  ?Jearld Fenton, NP ?09/24/21 2059 ? ?  ?Nance Pear, MD ?09/24/21 2111 ? ?

## 2021-09-24 NOTE — ED Notes (Signed)
Pt. States that she normally wears glasses and does not have them with her today. Visual screening at approx. 20 ft. Shows 20/30 vision in both the Left and Right Eyes.  ?

## 2021-09-24 NOTE — ED Triage Notes (Signed)
Pt to ED POV for L eye pain and swelling that started 2 days ago. ? ?States "it feels like something is behind my eyeball" and she feels pressure "back to my brain" and it hurts to open eye fully. ? ?States has been tearing a lot and is light sensitivity.  ? ?States pain is unbearable. L eye does look red. Denies any known injury. ?

## 2021-09-24 NOTE — Discharge Instructions (Signed)
You were seen today for left eye pain and redness.  You do not have any type of corneal abrasion.  Your eyes are extremely dry.  I am putting you on antibiotic eyedrops 4 times a day for the next 5 days.  Warm compresses may be helpful.  Please follow-up with the ophthalmologist if your symptoms persist. ?
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
## 2022-02-09 ENCOUNTER — Ambulatory Visit: Payer: Medicaid Other

## 2022-05-03 ENCOUNTER — Other Ambulatory Visit: Payer: Self-pay | Admitting: Family Medicine

## 2022-05-03 DIAGNOSIS — Z1231 Encounter for screening mammogram for malignant neoplasm of breast: Secondary | ICD-10-CM

## 2022-05-13 ENCOUNTER — Emergency Department
Admission: EM | Admit: 2022-05-13 | Discharge: 2022-05-13 | Disposition: A | Discharge status: Home / Self Care | Payer: BLUE CROSS/BLUE SHIELD | Attending: Emergency Medicine | Admitting: Emergency Medicine

## 2022-05-13 ENCOUNTER — Emergency Department: Payer: BLUE CROSS/BLUE SHIELD

## 2022-05-13 ENCOUNTER — Encounter: Payer: Self-pay | Admitting: Emergency Medicine

## 2022-05-13 ENCOUNTER — Other Ambulatory Visit: Payer: Self-pay

## 2022-05-13 DIAGNOSIS — J069 Acute upper respiratory infection, unspecified: Secondary | ICD-10-CM | POA: Insufficient documentation

## 2022-05-13 DIAGNOSIS — R059 Cough, unspecified: Secondary | ICD-10-CM | POA: Diagnosis present

## 2022-05-13 DIAGNOSIS — Z1152 Encounter for screening for COVID-19: Secondary | ICD-10-CM | POA: Diagnosis not present

## 2022-05-13 DIAGNOSIS — R030 Elevated blood-pressure reading, without diagnosis of hypertension: Secondary | ICD-10-CM | POA: Diagnosis not present

## 2022-05-13 LAB — RESP PANEL BY RT-PCR (FLU A&B, COVID) ARPGX2
Influenza A by PCR: NEGATIVE
Influenza B by PCR: NEGATIVE
SARS Coronavirus 2 by RT PCR: NEGATIVE

## 2022-05-13 MED ORDER — BENZONATATE 200 MG PO CAPS: 200.0000 mg | ORAL_CAPSULE | Freq: Three times a day (TID) | ORAL | 0 refills | Status: DC | PRN

## 2022-05-13 NOTE — ED Notes (Signed)
See triage note  Presents with cough and congestion  States cough is productive  but denies any fever   states she started out with sore throat 3 days ago

## 2022-05-13 NOTE — Discharge Instructions (Addendum)
Keep your appointment with your primary care provider today 11.  They will recheck your blood pressure and make a determination whether you need blood pressure medication.  Let them know that your blood pressure was elevated today in the emergency department 215/105 and 188/109.  A prescription for Tessalon was sent to the pharmacy for cough which will not increase your blood pressure medication.  Drink lots of fluids to stay hydrated.  May take Tylenol as needed for fever, body aches, headache.

## 2022-05-13 NOTE — ED Provider Notes (Signed)
[facility] Provider Note    Event Date/Time   First MD Initiated Contact with Patient 05/13/22 0820     (approximate)   History   Cough   HPI  Bonnie Mcdaniel is a 51 y.o. female presents to the ED with complaint of productive cough and congestion for last 3 days.  Patient states she has felt some chills but has not actually taken her temperature.  Patient reports fatigue and has been sleeping a lot.  She also is worried that she may have pneumonia.  Patient does have a history of hypertension but states she has not taken medication in "years".  She denies any chest pain, dizziness, headache, and was not aware that her blood pressure was elevated.     Physical Exam   Triage Vital Signs: ED Triage Vitals  Enc Vitals Group     BP 05/13/22 0815 (!) 215/105     Pulse Rate 05/13/22 0815 (!) 105     Resp 05/13/22 0815 19     Temp 05/13/22 0815 98.4 F (36.9 C)     Temp src --      SpO2 05/13/22 0815 98 %     Weight 05/13/22 0820 164 lb 14.5 oz (74.8 kg)     Height 05/13/22 0820 5\' 8"  (1.727 m)     Head Circumference --      Peak Flow --      Pain Score --      Pain Loc --      Pain Edu? --      Excl. in [place]? --     Most recent vital signs: Vitals:   05/13/22 0815 05/13/22 0940  BP: (!) 215/105 (!) 188/109  Pulse: (!) 105 76  Resp: 19 19  Temp: 98.4 F (36.9 C)   SpO2: 98% 97%     General: Awake, no distress.  Able to talk in complete sentences without any obvious shortness of breath. CV:  Good peripheral perfusion.  Heart regular rate and rhythm. Resp:  Normal effort.  Lungs are clear bilaterally. Abd:  No distention.  Other:  No nasal congestion.   ED Results / Procedures / Treatments   Labs (all labs ordered are listed, but only abnormal results are displayed) Labs Reviewed  RESP PANEL BY RT-PCR (FLU A&B, COVID) [id number]     Radiology:   2 view chest x-ray images were reviewed and interpreted as negative by myself.  Official  radiology report is negative for acute cardiopulmonary disease.  PROCEDURES:  Critical Care performed:   Procedures   MEDICATIONS ORDERED IN ED: Medications - No data to display   IMPRESSION / MDM / ASSESSMENT AND PLAN / ED COURSE  I reviewed the triage vital signs and the nursing notes.   Differential diagnosis includes, but is not limited to, viral upper respiratory infection, COVID, influenza, pneumonia.  Elevated blood pressure, hypertension uncontrolled, medically noncompliant.   51 year old female presents to the ED with complaint of cough and congestion for the last 3 days.  Patient is concerned that she may have pneumonia.  Respiratory panel was reassuring as COVID and influenza are negative.  Chest x-ray also is negative for acute cardiopulmonary disease and patient was reassured.  We had a long discussion about her elevated blood pressure and patient states that "years ago" she did take medication but does not recall the name of the medication and states that she has not had any problems with her blood pressure on any of her  medical visits at [facility].  She also reports that she has an appointment at [facility] clinic at 11:20 AM today.  Patient's blood pressures from triage and repeat in exam room were given to patient so that she can take them with her to her appointment today at 11:20 AM to show to her doctor.     Patient's presentation is most consistent with acute illness / injury with system symptoms.  FINAL CLINICAL IMPRESSION(S) / ED DIAGNOSES   Final diagnoses:  Viral URI with cough  Elevated blood pressure reading     Rx / DC Orders   ED Discharge Orders          Ordered    benzonatate (TESSALON) 200 MG capsule  3 times daily PRN        05/13/22 0953             Note:  This document was prepared using Dragon voice recognition software and may include unintentional dictation errors.   Tommi Rumps, PA-C 05/13/22 1121    Arnaldo Natal,  MD 05/13/22 [phone]

## 2022-05-13 NOTE — ED Notes (Signed)
Covid swab specimen sent to lab at this time

## 2022-05-13 NOTE — ED Triage Notes (Signed)
Pt comes with c/o cough, congestions and green phelgm. Pt states this started 3 days ago. Pt states she just feels tired. Pt states she might have pneumonia.

## 2022-07-08 ENCOUNTER — Emergency Department: Payer: Medicaid Other

## 2022-07-08 ENCOUNTER — Emergency Department
Admission: EM | Admit: 2022-07-08 | Discharge: 2022-07-08 | Disposition: A | Discharge status: Home / Self Care | Payer: Medicaid Other | Attending: Emergency Medicine | Admitting: Emergency Medicine

## 2022-07-08 ENCOUNTER — Other Ambulatory Visit: Payer: Self-pay

## 2022-07-08 DIAGNOSIS — D72829 Elevated white blood cell count, unspecified: Secondary | ICD-10-CM | POA: Diagnosis not present

## 2022-07-08 DIAGNOSIS — R110 Nausea: Secondary | ICD-10-CM | POA: Diagnosis not present

## 2022-07-08 DIAGNOSIS — I10 Essential (primary) hypertension: Secondary | ICD-10-CM | POA: Diagnosis not present

## 2022-07-08 DIAGNOSIS — N7011 Chronic salpingitis: Secondary | ICD-10-CM | POA: Insufficient documentation

## 2022-07-08 DIAGNOSIS — D252 Subserosal leiomyoma of uterus: Secondary | ICD-10-CM | POA: Diagnosis not present

## 2022-07-08 DIAGNOSIS — R1032 Left lower quadrant pain: Secondary | ICD-10-CM | POA: Insufficient documentation

## 2022-07-08 DIAGNOSIS — M5137 Other intervertebral disc degeneration, lumbosacral region: Secondary | ICD-10-CM | POA: Diagnosis not present

## 2022-07-08 DIAGNOSIS — M47814 Spondylosis without myelopathy or radiculopathy, thoracic region: Secondary | ICD-10-CM | POA: Diagnosis not present

## 2022-07-08 DIAGNOSIS — R1084 Generalized abdominal pain: Secondary | ICD-10-CM | POA: Diagnosis not present

## 2022-07-08 DIAGNOSIS — R109 Unspecified abdominal pain: Secondary | ICD-10-CM

## 2022-07-08 DIAGNOSIS — K769 Liver disease, unspecified: Secondary | ICD-10-CM | POA: Diagnosis not present

## 2022-07-08 LAB — LIPASE, BLOOD: Lipase: 30 U/L (ref 11–51)

## 2022-07-08 LAB — URINALYSIS, ROUTINE W REFLEX MICROSCOPIC
Bilirubin Urine: NEGATIVE
Glucose, UA: 50 mg/dL — AB
Hgb urine dipstick: NEGATIVE
Ketones, ur: 5 mg/dL — AB
Leukocytes,Ua: NEGATIVE
Nitrite: NEGATIVE
Protein, ur: NEGATIVE mg/dL
Specific Gravity, Urine: 1.014 (ref 1.005–1.030)
pH: 7 (ref 5.0–8.0)

## 2022-07-08 LAB — CBC
HCT: 41.2 % (ref 36.0–46.0)
Hemoglobin: 13.6 g/dL (ref 12.0–15.0)
MCH: 31.4 pg (ref 26.0–34.0)
MCHC: 33 g/dL (ref 30.0–36.0)
MCV: 95.2 fL (ref 80.0–100.0)
Platelets: 205 10*3/uL (ref 150–400)
RBC: 4.33 MIL/uL (ref 3.87–5.11)
RDW: 13.8 % (ref 11.5–15.5)
WBC: 12.1 10*3/uL — ABNORMAL HIGH (ref 4.0–10.5)
nRBC: 0 % (ref 0.0–0.2)

## 2022-07-08 LAB — CHLAMYDIA/NGC RT PCR (ARMC ONLY)
Chlamydia Tr: NOT DETECTED
N gonorrhoeae: NOT DETECTED

## 2022-07-08 LAB — COMPREHENSIVE METABOLIC PANEL WITH GFR
ALT: 9 U/L (ref 0–44)
AST: 16 U/L (ref 15–41)
Albumin: 3.5 g/dL (ref 3.5–5.0)
Alkaline Phosphatase: 54 U/L (ref 38–126)
Anion gap: 10 (ref 5–15)
BUN: 8 mg/dL (ref 6–20)
CO2: 24 mmol/L (ref 22–32)
Calcium: 8.8 mg/dL — ABNORMAL LOW (ref 8.9–10.3)
Chloride: 102 mmol/L (ref 98–111)
Creatinine, Ser: 0.68 mg/dL (ref 0.44–1.00)
GFR, Estimated: >60 mL/min (ref >60)
Glucose, Bld: 111 mg/dL — ABNORMAL HIGH (ref 70–99)
Potassium: 3.1 mmol/L — ABNORMAL LOW (ref 3.5–5.1)
Sodium: 136 mmol/L (ref 135–145)
Total Bilirubin: 0.7 mg/dL (ref 0.3–1.2)
Total Protein: 7.5 g/dL (ref 6.5–8.1)

## 2022-07-08 LAB — POC URINE PREG, ED: Preg Test, Ur: NEGATIVE

## 2022-07-08 MED ORDER — DOXYCYCLINE HYCLATE 100 MG PO TABS: 100.0000 mg | ORAL_TABLET | Freq: Two times a day (BID) | ORAL | 0 refills | Status: DC

## 2022-07-08 MED ORDER — HYDROCODONE-ACETAMINOPHEN 5-325 MG PO TABS: 1.0000 | ORAL_TABLET | ORAL | 0 refills | Status: DC | PRN

## 2022-07-08 MED ORDER — HYDROMORPHONE HCL 1 MG/ML IJ SOLN
0.5000 mg | Freq: Once | INTRAMUSCULAR | Status: AC
  Administered 2022-07-08: 0.5 mg via INTRAMUSCULAR
  Filled 2022-07-08: qty 0.5

## 2022-07-08 MED ORDER — HYDROMORPHONE HCL 1 MG/ML IJ SOLN
1.0000 mg | Freq: Once | INTRAMUSCULAR | Status: AC
  Administered 2022-07-08: 1 mg via INTRAMUSCULAR
  Filled 2022-07-08: qty 1

## 2022-07-08 MED ORDER — SODIUM CHLORIDE 0.9 % IV SOLN
1.0000 g | Freq: Once | INTRAVENOUS | Status: AC
  Administered 2022-07-08: 1 g via INTRAVENOUS
  Filled 2022-07-08: qty 10

## 2022-07-08 MED ORDER — ONDANSETRON 4 MG PO TBDP
4.0000 mg | ORAL_TABLET | Freq: Once | ORAL | Status: AC
  Administered 2022-07-08: 4 mg via ORAL
  Filled 2022-07-08: qty 1

## 2022-07-08 NOTE — ED Triage Notes (Signed)
Pt comes via EMS with c/o belly pain. Pt states this started yesterday with some nausea. Pt states it has gotten worse today.   BP-186/113

## 2022-07-08 NOTE — ED Provider Notes (Signed)
[facility] Provider Note    Event Date/Time   First MD Initiated Contact with Patient 07/08/22 1130     (approximate)  History   Chief Complaint: Abdominal Pain  HPI  Bonnie Mcdaniel is a 52 y.o. female with a past medical history of hypertension presents to the emergency department for left lower quadrant abdominal pain.  According to the patient last night she developed mild left lower quadrant abdominal pain that has progressively worsened overnight now has become more severe.  Normal bowel movements, no diarrhea.  No vomiting.  Denies any urinary symptoms no vaginal bleeding or discharge.  Patient describes the pain very low in the left lower quadrant.  Denies any history of known kidney stones or ovarian cysts.  Physical Exam   Triage Vital Signs: ED Triage Vitals  Enc Vitals Group     BP 07/08/22 1140 (!) 176/122     Pulse Rate 07/08/22 1140 83     Resp 07/08/22 1140 20     Temp 07/08/22 1140 98.8 F (37.1 C)     Temp Source 07/08/22 1140 Oral     SpO2 07/08/22 1140 99 %     Weight --      Height --      Head Circumference --      Peak Flow --      Pain Score 07/08/22 1111 7     Pain Loc --      Pain Edu? --      Excl. in [place]? --     Most recent vital signs: Vitals:   07/08/22 1140  BP: (!) 176/122  Pulse: 83  Resp: 20  Temp: 98.8 F (37.1 C)  SpO2: 99%    General: Awake, no distress.  CV:  Good peripheral perfusion.  Regular rate and rhythm  Resp:  Normal effort.  Equal breath sounds bilaterally.  Abd:  No distention.  Soft, moderate very low left lower quadrant tenderness.  No other tenderness in the abdomen, no rebound or guarding.    ED Results / Procedures / Treatments   RADIOLOGY  CT viewed and interpreted by myself does not appear to show any significant normality or obstruction. CT essentially read as negative for acute abnormality.   MEDICATIONS ORDERED IN ED: Medications  HYDROmorphone (DILAUDID) injection 1 mg  (1 mg Intramuscular Given 07/08/22 1224)  ondansetron (ZOFRAN-ODT) disintegrating tablet 4 mg (4 mg Oral Given 07/08/22 1224)     IMPRESSION / MDM / ASSESSMENT AND PLAN / ED COURSE  I reviewed the triage vital signs and the nursing notes.  Patient's presentation is most consistent with acute presentation with potential threat to life or bodily function.  Patient presents emergency department for left lower quadrant abdominal pain starting last night and progressively worsening.  Patient now states moderate to severe pain in the very low left lower quadrant.  Patient's lab work shows a very slight leukocytosis however patient states this is normal for her.  Chemistry is normal, lipase is normal pregnancy test is negative.  Urinalysis is normal.  Differential would still include colitis, diverticulitis, ureterolithiasis or ovarian pathology such as cyst or torsion.  We will obtain a pelvic ultrasound with Doppler to further evaluate as well as a CT renal scan.  We will dose pain medication and continue to closely monitor in the emergency department.  Patient agreeable to plan of care.  CT scan is negative however patient's ultrasound is showing a possible mild left hydrosalpinx.  As this  is in the area of the patient's discomfort I did talk to Dr. Amalia Hailey of OB/GYN through the midwife.  Recommends follow-up with him in the office.  Recommends gonorrhea/chlamydia testing although the patient denies any vaginal symptoms or discharge.  We have sent a gonorrhea/chlamydia swab.  I have dosed IV Rocephin and I will cover with doxycycline as a precaution.  Patient will follow-up with Dr. Amalia Hailey.  I spoke to the patient regarding follow-up precautions for worsening pain or fever.  Patient agreeable to plan.  Will discharge with short course of pain medication as well as doxycycline have the patient follow-up with OB/GYN.  The remainder the patient's lab work is reassuring including a normal chemistry, reassuring CBC,  reassuring urinalysis and a negative pregnancy test.  FINAL CLINICAL IMPRESSION(S) / ED DIAGNOSES   Lower quadrant abdominal pain Hydrosalpinx   Note:  This document was prepared using Dragon voice recognition software and may include unintentional dictation errors.   Harvest Dark, MD 07/08/22 1651

## 2022-07-08 NOTE — Discharge Instructions (Addendum)
Please take antibiotics as prescribed for the entire course.  Please take pain medication as needed but only as written.  Do not drink alcohol or drive while taking this medication.  Please follow-up with Dr. Amalia Hailey by calling the number provided Monday morning to arrange a follow-up appointment preferably next week.  Return to the emergency department for any worsening pain, fever or any other symptom concerning to yourself.

## 2022-07-18 ENCOUNTER — Telehealth: Payer: Self-pay | Admitting: Obstetrics and Gynecology

## 2022-07-18 NOTE — Telephone Encounter (Signed)
Patient is aware of location date time and provider.

## 2022-07-18 NOTE — Telephone Encounter (Signed)
I contacted patient about scheduled appointment for 1/23 ER follow with Dr. Marcelline Mates. I left message for patient to call back to confirm this scheduled appointment.

## 2022-07-19 ENCOUNTER — Encounter: Payer: BLUE CROSS/BLUE SHIELD | Admitting: Obstetrics and Gynecology

## 2022-07-27 ENCOUNTER — Encounter: Payer: Self-pay | Admitting: Obstetrics and Gynecology

## 2022-07-27 ENCOUNTER — Ambulatory Visit (INDEPENDENT_AMBULATORY_CARE_PROVIDER_SITE_OTHER): Payer: Medicaid Other | Admitting: Obstetrics and Gynecology

## 2022-07-27 VITALS — BP 132/88 | HR 78 | Ht 68.0 in | Wt 182.1 lb

## 2022-07-27 DIAGNOSIS — Z7689 Persons encountering health services in other specified circumstances: Secondary | ICD-10-CM

## 2022-07-27 DIAGNOSIS — N920 Excessive and frequent menstruation with regular cycle: Secondary | ICD-10-CM

## 2022-07-27 DIAGNOSIS — N946 Dysmenorrhea, unspecified: Secondary | ICD-10-CM

## 2022-07-27 DIAGNOSIS — N7011 Chronic salpingitis: Secondary | ICD-10-CM

## 2022-07-27 DIAGNOSIS — Z30011 Encounter for initial prescription of contraceptive pills: Secondary | ICD-10-CM

## 2022-07-27 MED ORDER — DESOGESTREL-ETHINYL ESTRADIOL 0.15-0.02/0.01 MG (21/5) PO TABS: 1.0000 | ORAL_TABLET | Freq: Every day | ORAL | 1 refills | Status: AC

## 2022-07-27 NOTE — Progress Notes (Signed)
HPI:      [name] is a 52 y.o. G4P0 who LMP was Patient's last menstrual period was 07/20/2022 (approximate).  Subjective:   She presents today as an ED follow-up.  She began experiencing left-sided pelvic pain.  And went to the ED she was found to have a mildly dilated left fallopian tube.  She was given Rocephin and doxycycline.  She reports that her pain is mostly gone now. She also states that over the last few years her periods have gotten heavier.  She states that 3 to 4 days of her menses are very heavy.  She would like to take OCPs for cycle control. She reports that she does not use tobacco products.    Hx: The following portions of the patient's history were reviewed and updated as appropriate:             She  has a past medical history of Hypertension, Medical history non-contributory, and Murmur. She does not have any pertinent problems on file. She  has a past surgical history that includes Cesarean section. Her family history includes Hypertension in her mother. She  reports that she has been smoking cigarettes. She has never used smokeless tobacco. She reports that she does not drink alcohol and does not use drugs. She currently has no medications in their medication list. She has No Known Allergies.       Review of Systems:  Review of Systems  Constitutional: Denied constitutional symptoms, night sweats, recent illness, fatigue, fever, insomnia and weight loss.  Eyes: Denied eye symptoms, eye pain, photophobia, vision change and visual disturbance.  Ears/Nose/Throat/Neck: Denied ear, nose, throat or neck symptoms, hearing loss, nasal discharge, sinus congestion and sore throat.  Cardiovascular: Denied cardiovascular symptoms, arrhythmia, chest pain/pressure, edema, exercise intolerance, orthopnea and palpitations.  Respiratory: Denied pulmonary symptoms, asthma, pleuritic pain, productive sputum, cough, dyspnea and wheezing.  Gastrointestinal: Denied,  gastro-esophageal reflux, melena, nausea and vomiting.  Genitourinary: See HPI for additional information.  Musculoskeletal: Denied musculoskeletal symptoms, stiffness, swelling, muscle weakness and myalgia.  Dermatologic: Denied dermatology symptoms, rash and scar.  Neurologic: Denied neurology symptoms, dizziness, headache, neck pain and syncope.  Psychiatric: Denied psychiatric symptoms, anxiety and depression.  Endocrine: Denied endocrine symptoms including hot flashes and night sweats.   Meds:   No current outpatient medications on file prior to visit.   No current facility-administered medications on file prior to visit.      Objective:     Vitals:   07/27/22 1535  BP: 132/88  Pulse: 78   Filed Weights   07/27/22 1535  Weight: 182 lb 1.6 oz (82.6 kg)              ED results and ultrasound reviewed          Assessment:    G4P0 Patient Active Problem List   Diagnosis Date Noted   Sepsis ([place]) 03/23/2016   Pneumonia 03/23/2016     1. Establishing care with new doctor, encounter for   2. Hydrosalpinx   3. Menorrhagia with regular cycle   4. Dysmenorrhea   5. Initiation of OCP (BCP)     Likely hydrosalpinx causing pelvic pain.  It is reported as mild.  Patient treated with Rocephin and Doxy.   Plan:            1.  Expectant management of left hydrosalpinx.  Patient currently not having pain  2.  OCP started for menorrhagia and dysmenorrhea. OCPs The risks /benefits of OCPs  have been explained to the patient in detail.  Product literature has been given to her where appropriate.  I have instructed her in the use of OCPs.  I have explained to the patient that OCPs are not as effective for birth control during the first month of use, and that another form of contraception should be used during this time.  Both first-day start and Sunday start have been explained.  The risks and benefits of each was discussed.  She has been made aware of  the fact that in rare  circumstances, other medications may affect the efficacy of OCPs.  I have answered all of her questions, and I believe that she has an understanding of the effectiveness and use of OCPs. I will start Mircette   Orders No orders of the defined types were placed in this encounter.   No orders of the defined types were placed in this encounter.     F/U  Return in about 4 months (around 11/25/2022). I spent 32 minutes involved in the care of this patient preparing to see the patient by obtaining and reviewing her medical history (including labs, imaging tests and prior procedures), documenting clinical information in the electronic health record (EHR), counseling and coordinating care plans, writing and sending prescriptions, ordering tests or procedures and in direct communicating with the patient and medical staff discussing pertinent items from her history and physical exam.  Finis Bud, M.D. 07/27/2022 3:55 PM

## 2022-07-27 NOTE — Progress Notes (Signed)
Patient presents today for an ED follow-up. She states doing better and that her pain is tolerable now. She reports heavy cycles and would like to discuss birth control options. No additional concerns.

## 2023-04-22 DIAGNOSIS — B084 Enteroviral vesicular stomatitis with exanthem: Secondary | ICD-10-CM | POA: Diagnosis not present

## 2023-04-22 DIAGNOSIS — R21 Rash and other nonspecific skin eruption: Secondary | ICD-10-CM | POA: Diagnosis not present

## 2023-05-09 DIAGNOSIS — B341 Enterovirus infection, unspecified: Secondary | ICD-10-CM | POA: Diagnosis not present

## 2023-06-14 DIAGNOSIS — R21 Rash and other nonspecific skin eruption: Secondary | ICD-10-CM | POA: Diagnosis not present

## 2023-07-04 DIAGNOSIS — A539 Syphilis, unspecified: Secondary | ICD-10-CM | POA: Diagnosis not present
